# Patient Record
Sex: Female | Born: 1980 | Race: White | Hispanic: No | Marital: Married | Smoking: Former smoker
Health system: Southern US, Community
[De-identification: ages and names within clinical notes are randomized; demographics above are authoritative.]

## PROBLEM LIST (undated history)

## (undated) DIAGNOSIS — T8859XA Other complications of anesthesia, initial encounter: Secondary | ICD-10-CM

## (undated) DIAGNOSIS — T4145XA Adverse effect of unspecified anesthetic, initial encounter: Secondary | ICD-10-CM

## (undated) DIAGNOSIS — T7840XA Allergy, unspecified, initial encounter: Secondary | ICD-10-CM

## (undated) HISTORY — DX: Allergy, unspecified, initial encounter: T78.40XA

---

## 2001-08-11 ENCOUNTER — Other Ambulatory Visit: Admission: RE | Admit: 2001-08-11 | Discharge: 2001-08-11 | Payer: Self-pay | Admitting: Obstetrics and Gynecology

## 2001-08-11 ENCOUNTER — Other Ambulatory Visit: Admission: RE | Admit: 2001-08-11 | Discharge: 2001-08-11 | Payer: Self-pay | Admitting: Gynecology

## 2002-02-02 ENCOUNTER — Other Ambulatory Visit: Admission: RE | Admit: 2002-02-02 | Discharge: 2002-02-02 | Payer: Self-pay | Admitting: Obstetrics and Gynecology

## 2002-04-20 ENCOUNTER — Other Ambulatory Visit: Admission: RE | Admit: 2002-04-20 | Discharge: 2002-04-20 | Payer: Self-pay | Admitting: Obstetrics and Gynecology

## 2002-07-18 ENCOUNTER — Other Ambulatory Visit: Admission: RE | Admit: 2002-07-18 | Discharge: 2002-07-18 | Payer: Self-pay | Admitting: Obstetrics and Gynecology

## 2002-11-03 ENCOUNTER — Other Ambulatory Visit: Admission: RE | Admit: 2002-11-03 | Discharge: 2002-11-03 | Payer: Self-pay | Admitting: Obstetrics and Gynecology

## 2003-05-10 ENCOUNTER — Other Ambulatory Visit: Admission: RE | Admit: 2003-05-10 | Discharge: 2003-05-10 | Payer: Self-pay | Admitting: Obstetrics and Gynecology

## 2004-05-05 ENCOUNTER — Other Ambulatory Visit: Admission: RE | Admit: 2004-05-05 | Discharge: 2004-05-05 | Payer: Self-pay | Admitting: Obstetrics and Gynecology

## 2006-10-22 ENCOUNTER — Inpatient Hospital Stay (HOSPITAL_COMMUNITY): Admission: RE | Admit: 2006-10-22 | Discharge: 2006-10-25 | Payer: Self-pay | Admitting: Obstetrics and Gynecology

## 2010-09-30 NOTE — Op Note (Signed)
NAME:  Tricia Castaneda, Tricia Castaneda                ACCOUNT NO.:  [id number]   MEDICAL RECORD NO.:  [id number]          PATIENT TYPE:  INP   LOCATION:  9142                          FACILITY:  [facility]   PHYSICIAN:  Randye Lobo, M.D.   DATE OF BIRTH:  11/29/1980   DATE OF PROCEDURE:  10/22/2006  DATE OF DISCHARGE:                               OPERATIVE REPORT   PREOPERATIVE DIAGNOSES:  1. Intrauterine gestation at 84 plus 4 weeks.  2. Homero Fellers breech presentation.   POSTOPERATIVE DIAGNOSES:  1. Intrauterine gestation at 38 plus weeks.  2. Homero Fellers breech presentation.   PROCEDURE:  Primary low segment transverse cesarean section.   SURGEON:  Conley Simmonds, M.D.   ASSISTANT:  Ilda Mori, M.D.   ANESTHESIA:  Spinal.   IV FLUIDS:  2250 mL.   URINE OUTPUT:  240 mL.   ESTIMATED BLOOD LOSS:  700 mL.   COMPLICATIONS:  None   INDICATIONS FOR PROCEDURE:  The patient is a 30 year old, gravida 1,  para 0, Caucasian female at 67 plus [redacted] weeks gestation who was noted to  have a frank breech presentation on examination in the office.  Ultrasound confirmed a frank breech presentation.  The patient now  desires a primary cesarean section with no trial of an external cephalic  version and vaginal delivery.  Final ultrasound prior to beginning the  cesarean section demonstrated again the frank breech presentation.  A  plan is made now to proceed with primary cesarean section after risks,  benefits, and alternatives are reviewed.   FINDINGS:  A viable female was delivered at 12:30.  Apgars 8 at one  minute and 9 at five minutes.  Frank breech presentation was confirmed.  Weight 8 pounds 5 ounces.  Amniotic fluid clear.  The uterus, tubes and  ovaries were normal.   SPECIMENS:  None.   PROCEDURE:  The patient was reidentified in the preoperative hold area.  She received Ancef 1 gram IV for antibiotic prophylaxis.  In the  operating room, she received a spinal anesthetic.  She was placed in the  supine  position with a left lateral tilt, and the abdomen was sterilely  prepped and draped.  A Foley catheter was sterilely placed prior to  this.   An Allis test was performed, and the patient had adequate surgical  anesthesia.  A Pfannenstiel incision was created sharply with a scalpel.  This was carried down to the fascia using monopolar cautery for  hemostasis.  The fascia was incised in the midline and was extended  bilaterally with the Mayo scissors.  The rectus muscles were dissected  off the fascia superiorly and inferiorly.  The rectus muscles were then  sharply divided in the midline.  The parietal peritoneum was entered  sharply after it was elevated with two hemostat clamps.  The peritoneal  incision was extended cranially and caudally.   The lower uterine segment was exposed with a bladder retractor, and a  bladder flap was sharply created.  A transverse lower uterine segment  incision was then created sharply with a scalpel.  Membranes were  ruptured, and  the fluid was clear.  The uterine incision was extended  bilaterally in an upward fashion using a bandage scissors.  A hand was  inserted through the uterine incision, and the baby was noted to have  converted to a partially transverse presentation.  The buttocks were  drawn down to the incision, and with fundal pressure, the buttocks were  delivered.  Each of the legs were then delivered separately and gently.  The arms were swept across the chest, and the vertex was then delivered.  A double nuchal cord was reduced.  The nares and mouth were suctioned.  The cord was doubly clamped and cut, and the newborn was carried over to  the waiting pediatrician in good condition.   The placenta was manually extracted and was set aside for cord blood  collection and cord blood donation.  The uterus was then exteriorized,  and it was wiped clean with a moistened lap pad.  The patient did  receive Pitocin 20 units IV.  The uterine  incision was then closed with  a double layer closure of #1 chromic.  The first was a running locked  layer, and the second was an imbricating layer.  The uterine incision  was noted to be hemostatic.   The uterus was returned to the peritoneal cavity which was irrigated and  suctioned.  The abdomen was closed.  The parietal peritoneum was closed  with a running suture of 3-0 Vicryl.  The rectus muscles were  reapproximated in the midline with interrupted sutures of #1 chromic.  The fascia was closed with a running suture of 0 Vicryl.  The  subcutaneous layer was irrigated and suctioned and made hemostatic with  monopolar cautery.  Staples were used to close the skin, and a sterile  bandage was placed over this.  This concluded the patient's procedure.  There were no complications.  All needle, instrument and sponge counts  were correct.  The patient was escorted to the recovery room in stable  and awake condition.      Randye Lobo, M.D.  Electronically Signed     BES/MEDQ  D:  10/22/2006  T:  10/22/2006  Job:  [id number]

## 2010-10-03 NOTE — Discharge Summary (Signed)
NAME:  Tricia Castaneda, Tricia Castaneda                ACCOUNT NO.:  [id number]   MEDICAL RECORD NO.:  [id number]          PATIENT TYPE:  INP   LOCATION:  9142                          FACILITY:  [facility]   PHYSICIAN:  Malva Limes, M.D.    DATE OF BIRTH:  February 01, 1981   DATE OF ADMISSION:  10/22/2006  DATE OF DISCHARGE:  10/25/2006                               DISCHARGE SUMMARY   FINAL DIAGNOSIS:  Intrauterine pregnancy at 38-4/[redacted] weeks gestation,  frank breech presentation.   PROCEDURE:  Primary low segment transverse cesarean section.  Surgeon  Dr. Conley Simmonds.  Assistant Dr. Ilda Mori.  Complications none.   This 30 year old G1 P0 presents at 38-4/[redacted] weeks gestation having a known  breech presentation.  Ultrasound confirmed this presentation and she now  presents for her cesarean section.  Her antepartum course up to this  point had been uncomplicated.  She was a smoker that quit with pregnancy  and she does have a non-immune rubella status.  Otherwise she had a  negative group B strep culture obtained in the office at 36 weeks.  The  patient is taken to the operating room on October 22, 2006 by Dr. Conley Simmonds where a primary low segment transverse cesarean section was  performed with the delivery of 8 pounds 5 ounces female infant with  Apgars of eight and nine.  Delivery went without complications.  The  patient's postoperative course was benign without any significant  fevers.  The patient was felt ready for discharge on postoperative day  #3.  She was sent home on a regular diet, told to decrease activities,  told to continue her prenatal vitamins, was given a rubella vaccine  before discharge.  Was told she could use Motrin up to 600 mg every 6  hours as needed for pain and was to follow up in the office in 4-6 weeks  with Dr. Edward Jolly.   LABS ON DISCHARGE:  The patient had a hemoglobin of 11.2, white blood  cell count of 13.0 and platelets of 190,000.      [name], P.A.-C.    ______________________________  Malva Limes, M.D.    MB/MEDQ  D:  11/26/2006  T:  11/27/2006  Job:  [id number]

## 2011-03-05 LAB — URINALYSIS, ROUTINE W REFLEX MICROSCOPIC
Bilirubin Urine: NEGATIVE
Nitrite: NEGATIVE
Specific Gravity, Urine: 1.01
Urobilinogen, UA: 0.2
pH: 6

## 2011-03-05 LAB — SAMPLE TO BLOOD BANK

## 2011-03-05 LAB — CBC
Hemoglobin: 12.7
MCHC: 33.8
Platelets: 190
RBC: 3.64 — ABNORMAL LOW
RBC: 4.18
WBC: 13 — ABNORMAL HIGH
WBC: 9.3

## 2011-03-05 LAB — RPR: RPR Ser Ql: NONREACTIVE

## 2011-03-05 LAB — CCBB MATERNAL DONOR DRAW

## 2013-08-11 ENCOUNTER — Ambulatory Visit: Payer: Self-pay | Admitting: Physician Assistant

## 2013-08-11 ENCOUNTER — Encounter (INDEPENDENT_AMBULATORY_CARE_PROVIDER_SITE_OTHER): Payer: Self-pay

## 2013-08-11 ENCOUNTER — Ambulatory Visit (INDEPENDENT_AMBULATORY_CARE_PROVIDER_SITE_OTHER): Payer: BC Managed Care – PPO | Admitting: Physician Assistant

## 2013-08-11 ENCOUNTER — Encounter: Payer: Self-pay | Admitting: Physician Assistant

## 2013-08-11 VITALS — BP 104/69 | HR 90 | Temp 99.4°F | Ht 65.5 in | Wt 160.0 lb

## 2013-08-11 DIAGNOSIS — L709 Acne, unspecified: Secondary | ICD-10-CM

## 2013-08-11 DIAGNOSIS — R635 Abnormal weight gain: Secondary | ICD-10-CM

## 2013-08-11 DIAGNOSIS — L708 Other acne: Secondary | ICD-10-CM

## 2013-08-11 DIAGNOSIS — B360 Pityriasis versicolor: Secondary | ICD-10-CM

## 2013-08-11 MED ORDER — FLUCONAZOLE 150 MG PO TABS: 150.0000 mg | ORAL_TABLET | Freq: Every day | ORAL | Status: DC

## 2013-08-11 MED ORDER — PHENTERMINE HCL 37.5 MG PO CAPS: 37.5000 mg | ORAL_CAPSULE | ORAL | Status: DC

## 2013-08-11 MED ORDER — TRETINOIN 0.025 % EX CREA: TOPICAL_CREAM | Freq: Every day | CUTANEOUS | Status: DC

## 2013-08-11 MED ORDER — KETOCONAZOLE 2 % EX SHAM: 1.0000 "application " | MEDICATED_SHAMPOO | CUTANEOUS | Status: DC

## 2013-08-11 MED ORDER — KETOCONAZOLE 2 % EX SHAM: 1.0000 "application " | MEDICATED_SHAMPOO | Freq: Once | CUTANEOUS | Status: DC

## 2013-08-26 NOTE — Progress Notes (Signed)
Patient ID: Tricia Castaneda, female   DOB: 1981-04-23, 33 y.o.   MRN: [id number]  S: 33 y/o female with c/o worsening acne on face over the past couple of months. Has tried various OTC topicals with no relief.   O: No active lesions on chest and back. Few open and closed comedomes on face, primarily chin and forehead.   Assessment:  1.  Acne of face 2. Obesity  Plan:  Advised patient to continue taking oral contraceptive since this can help with adult acne, along with prescribing .025% tretinoin qhs, bb size amoutn to entire face. Detailed instructions were given on application and the importance of not overusing the product. I do not feel that oral antibiotics are needed at this time. She will RTC if acne worsens or does not improve. We also discussed her recent weight gain and I have agreed to prescribe phentermine 37.5 mg q day on a one time basis. She will need to return to the weight clinic she was previously attending when this prescription is complete.

## 2014-06-01 ENCOUNTER — Other Ambulatory Visit: Payer: Self-pay | Admitting: Physician Assistant

## 2014-07-11 ENCOUNTER — Other Ambulatory Visit: Payer: Self-pay | Admitting: Physician Assistant

## 2014-07-11 MED ORDER — DOXYCYCLINE HYCLATE 100 MG PO CAPS: 100.0000 mg | ORAL_CAPSULE | Freq: Two times a day (BID) | ORAL | Status: DC

## 2015-05-15 ENCOUNTER — Encounter: Payer: Self-pay | Admitting: Physician Assistant

## 2015-05-15 ENCOUNTER — Ambulatory Visit (INDEPENDENT_AMBULATORY_CARE_PROVIDER_SITE_OTHER): Payer: No Typology Code available for payment source | Admitting: Physician Assistant

## 2015-05-15 VITALS — BP 120/84 | HR 87 | Temp 97.9°F | Ht 65.5 in | Wt 172.4 lb

## 2015-05-15 DIAGNOSIS — J069 Acute upper respiratory infection, unspecified: Secondary | ICD-10-CM

## 2015-05-15 MED ORDER — AMOXICILLIN-POT CLAVULANATE 875-125 MG PO TABS: 1.0000 | ORAL_TABLET | Freq: Two times a day (BID) | ORAL | Status: DC

## 2015-05-15 NOTE — Progress Notes (Signed)
Subjective:     Patient ID: Tricia Castaneda, female   DOB: 1980/08/21, 34 y.o.   MRN: [id number]  HPI Pt with cough and congestion for several days Hx of bronchitis  Review of Systems  Constitutional: Negative for fever, activity change, appetite change and fatigue.  HENT: Positive for congestion, postnasal drip and sinus pressure. Negative for ear pain and sore throat.   Respiratory: Positive for cough. Negative for chest tightness, shortness of breath and wheezing.   Cardiovascular: Negative.        Objective:   Physical Exam  Constitutional: She appears well-developed and well-nourished.  HENT:  Right Ear: External ear normal.  Left Ear: External ear normal.  Mouth/Throat: Oropharyngeal exudate present.  Neck: Neck supple.  Cardiovascular: Normal rate, regular rhythm and normal heart sounds.   Pulmonary/Chest: Effort normal. No respiratory distress. She has no wheezes. She exhibits no tenderness.  Coarse lungs sounds that clear well with cough  Lymphadenopathy:    She has no cervical adenopathy.  Nursing note and vitals reviewed.      Assessment:     URI    Plan:     Augmentin 875mg  bid x 10 day Smoking cessation Fluids Rest F/U prn

## 2015-05-15 NOTE — Patient Instructions (Signed)
Upper Respiratory Infection, Adult °Most upper respiratory infections (URIs) are a viral infection of the air passages leading to the lungs. A URI affects the nose, throat, and upper air passages. The most common type of URI is nasopharyngitis and is typically referred to as "the common cold." °URIs run their course and usually go away on their own. Most of the time, a URI does not require medical attention, but sometimes a bacterial infection in the upper airways can follow a viral infection. This is called a secondary infection. Sinus and middle ear infections are common types of secondary upper respiratory infections. °Bacterial pneumonia can also complicate a URI. A URI can worsen asthma and chronic obstructive pulmonary disease (COPD). Sometimes, these complications can require emergency medical care and may be life threatening.  °CAUSES °Almost all URIs are caused by viruses. A virus is a type of germ and can spread from one person to another.  °RISKS FACTORS °You may be at risk for a URI if:  °· You smoke.   °· You have chronic heart or lung disease. °· You have a weakened defense (immune) system.   °· You are very young or very old.   °· You have nasal allergies or asthma. °· You work in crowded or poorly ventilated areas. °· You work in health care facilities or schools. °SIGNS AND SYMPTOMS  °Symptoms typically develop 2-3 days after you come in contact with a cold virus. Most viral URIs last 7-10 days. However, viral URIs from the influenza virus (flu virus) can last 14-18 days and are typically more severe. Symptoms may include:  °· Runny or stuffy (congested) nose.   °· Sneezing.   °· Cough.   °· Sore throat.   °· Headache.   °· Fatigue.   °· Fever.   °· Loss of appetite.   °· Pain in your forehead, behind your eyes, and over your cheekbones (sinus pain). °· Muscle aches.   °DIAGNOSIS  °Your health care provider may diagnose a URI by: °· Physical exam. °· Tests to check that your symptoms are not due to  another condition such as: °¨ Strep throat. °¨ Sinusitis. °¨ Pneumonia. °¨ Asthma. °TREATMENT  °A URI goes away on its own with time. It cannot be cured with medicines, but medicines may be prescribed or recommended to relieve symptoms. Medicines may help: °· Reduce your fever. °· Reduce your cough. °· Relieve nasal congestion. °HOME CARE INSTRUCTIONS  °· Take medicines only as directed by your health care provider.   °· Gargle warm saltwater or take cough drops to comfort your throat as directed by your health care provider. °· Use a warm mist humidifier or inhale steam from a shower to increase air moisture. This may make it easier to breathe. °· Drink enough fluid to keep your urine clear or pale yellow.   °· Eat soups and other clear broths and maintain good nutrition.   °· Rest as needed.   °· Return to work when your temperature has returned to normal or as your health care provider advises. You may need to stay home longer to avoid infecting others. You can also use a face mask and careful hand washing to prevent spread of the virus. °· Increase the usage of your inhaler if you have asthma.   °· Do not use any tobacco products, including cigarettes, chewing tobacco, or electronic cigarettes. If you need help quitting, ask your health care provider. °PREVENTION  °The best way to protect yourself from getting a cold is to practice good hygiene.  °· Avoid oral or hand contact with people with cold   symptoms.   °· Wash your hands often if contact occurs.   °There is no clear evidence that vitamin C, vitamin E, echinacea, or exercise reduces the chance of developing a cold. However, it is always recommended to get plenty of rest, exercise, and practice good nutrition.  °SEEK MEDICAL CARE IF:  °· You are getting worse rather than better.   °· Your symptoms are not controlled by medicine.   °· You have chills. °· You have worsening shortness of breath. °· You have brown or red mucus. °· You have yellow or brown nasal  discharge. °· You have pain in your face, especially when you bend forward. °· You have a fever. °· You have swollen neck glands. °· You have pain while swallowing. °· You have white areas in the back of your throat. °SEEK IMMEDIATE MEDICAL CARE IF:  °· You have severe or persistent: °¨ Headache. °¨ Ear pain. °¨ Sinus pain. °¨ Chest pain. °· You have chronic lung disease and any of the following: °¨ Wheezing. °¨ Prolonged cough. °¨ Coughing up blood. °¨ A change in your usual mucus. °· You have a stiff neck. °· You have changes in your: °¨ Vision. °¨ Hearing. °¨ Thinking. °¨ Mood. °MAKE SURE YOU:  °· Understand these instructions. °· Will watch your condition. °· Will get help right away if you are not doing well or get worse. °  °This information is not intended to replace advice given to you by your health care provider. Make sure you discuss any questions you have with your health care provider. °  °Document Released: 10/28/2000 Document Revised: 09/18/2014 Document Reviewed: 08/09/2013 °Elsevier Interactive Patient Education ©2016 [employer] °Smoking Cessation, Tips for Success °If you are ready to quit smoking, congratulations! You have chosen to help yourself be healthier. Cigarettes bring nicotine, tar, carbon monoxide, and other irritants into your body. Your lungs, heart, and blood vessels will be able to work better without these poisons. There are many different ways to quit smoking. Nicotine gum, nicotine patches, a nicotine inhaler, or nicotine nasal spray can help with physical craving. Hypnosis, support groups, and medicines help break the habit of smoking. °WHAT THINGS CAN I DO TO MAKE QUITTING EASIER?  °Here are some tips to help you quit for good: °· Pick a date when you will quit smoking completely. Tell all of your friends and family about your plan to quit on that date. °· Do not try to slowly cut down on the number of cigarettes you are smoking. Pick a quit date and quit smoking completely  starting on that day. °· Throw away all cigarettes.   °· Clean and remove all ashtrays from your home, work, and car. °· On a card, write down your reasons for quitting. Carry the card with you and read it when you get the urge to smoke. °· Cleanse your body of nicotine. Drink enough water and fluids to keep your urine clear or pale yellow. Do this after quitting to flush the nicotine from your body. °· Learn to predict your moods. Do not let a bad situation be your excuse to have a cigarette. Some situations in your life might tempt you into wanting a cigarette. °· Never have "just one" cigarette. It leads to wanting another and another. Remind yourself of your decision to quit. °· Change habits associated with smoking. If you smoked while driving or when feeling stressed, try other activities to replace smoking. Stand up when drinking your coffee. Brush your teeth after eating. Sit in a   different chair when you read the paper. Avoid alcohol while trying to quit, and try to drink fewer caffeinated beverages. Alcohol and caffeine may urge you to smoke. °· Avoid foods and drinks that can trigger a desire to smoke, such as sugary or spicy foods and alcohol. °· Ask people who smoke not to smoke around you. °· Have something planned to do right after eating or having a cup of coffee. For example, plan to take a walk or exercise. °· Try a relaxation exercise to calm you down and decrease your stress. Remember, you may be tense and nervous for the first 2 weeks after you quit, but this will pass. °· Find new activities to keep your hands busy. Play with a pen, coin, or rubber band. Doodle or draw things on paper. °· Brush your teeth right after eating. This will help cut down on the craving for the taste of tobacco after meals. You can also try mouthwash.   °· Use oral substitutes in place of cigarettes. Try using lemon drops, carrots, cinnamon sticks, or chewing gum. Keep them handy so they are available when you have  the urge to smoke. °· When you have the urge to smoke, try deep breathing. °· Designate your home as a nonsmoking area. °· If you are a heavy smoker, ask your health care provider about a prescription for nicotine chewing gum. It can ease your withdrawal from nicotine. °· Reward yourself. Set aside the cigarette money you save and buy yourself something nice. °· Look for support from others. Join a support group or smoking cessation program. Ask someone at home or at work to help you with your plan to quit smoking. °· Always ask yourself, "Do I need this cigarette or is this just a reflex?" Tell yourself, "Today, I choose not to smoke," or "I do not want to smoke." You are reminding yourself of your decision to quit. °· Do not replace cigarette smoking with electronic cigarettes (commonly called e-cigarettes). The safety of e-cigarettes is unknown, and some may contain harmful chemicals. °· If you relapse, do not give up! Plan ahead and think about what you will do the next time you get the urge to smoke. °HOW WILL I FEEL WHEN I QUIT SMOKING? °You may have symptoms of withdrawal because your body is used to nicotine (the addictive substance in cigarettes). You may crave cigarettes, be irritable, feel very hungry, cough often, get headaches, or have difficulty concentrating. The withdrawal symptoms are only temporary. They are strongest when you first quit but will go away within 10-14 days. When withdrawal symptoms occur, stay in control. Think about your reasons for quitting. Remind yourself that these are signs that your body is healing and getting used to being without cigarettes. Remember that withdrawal symptoms are easier to treat than the major diseases that smoking can cause.  °Even after the withdrawal is over, expect periodic urges to smoke. However, these cravings are generally short lived and will go away whether you smoke or not. Do not smoke! °WHAT RESOURCES ARE AVAILABLE TO HELP ME QUIT SMOKING? °Your  health care provider can direct you to community resources or hospitals for support, which may include: °· Group support. °· Education. °· Hypnosis. °· Therapy. °  °This information is not intended to replace advice given to you by your health care provider. Make sure you discuss any questions you have with your health care provider. °  °Document Released: 01/31/2004 Document Revised: 05/25/2014 Document Reviewed: 10/20/2012 °Elsevier Interactive Patient Education ©  2016 [employer] ° °

## 2015-08-06 ENCOUNTER — Telehealth: Payer: Self-pay | Admitting: Family Medicine

## 2015-08-06 NOTE — Telephone Encounter (Signed)
Pt reported

## 2015-08-08 ENCOUNTER — Ambulatory Visit (INDEPENDENT_AMBULATORY_CARE_PROVIDER_SITE_OTHER): Payer: No Typology Code available for payment source | Admitting: Family Medicine

## 2015-08-08 VITALS — BP 121/87 | HR 73 | Temp 97.3°F | Ht 65.0 in | Wt 173.0 lb

## 2015-08-08 DIAGNOSIS — H6501 Acute serous otitis media, right ear: Secondary | ICD-10-CM | POA: Diagnosis not present

## 2015-08-08 MED ORDER — PSEUDOEPHEDRINE-GUAIFENESIN ER 120-1200 MG PO TB12
1.0000 | ORAL_TABLET | Freq: Two times a day (BID) | ORAL | Status: DC
Start: 2015-08-08 — End: 2016-08-17

## 2015-08-08 MED ORDER — AMOXICILLIN-POT CLAVULANATE 875-125 MG PO TABS: 1.0000 | ORAL_TABLET | Freq: Two times a day (BID) | ORAL | Status: DC

## 2015-08-08 MED ORDER — BETAMETHASONE SOD PHOS & ACET 6 (3-3) MG/ML IJ SUSP
6.0000 mg | Freq: Once | INTRAMUSCULAR | Status: AC
  Administered 2015-08-08: 6 mg via INTRAMUSCULAR

## 2015-08-08 NOTE — Progress Notes (Signed)
Subjective:  Patient ID: Tricia Castaneda, female    DOB: 1981-05-03  Age: 35 y.o. MRN: [id number]  CC: Otalgia   HPI Tricia Castaneda presents for Patient presents with upper respiratory congestion.There is right ear pain for 2-3days. There is moderate sore throat. Patient reports minimal cough. There is no fever no chills no sweats. The patient deniesloss of hearing Onset of initial symptoms was 3-5 days ago. Gradually worsening in spite of home remedies.    History Tricia Castaneda has no past medical history on file.   Tricia Castaneda has past surgical history that includes Cesarean section.   Her family history is not on file.Tricia Castaneda reports that Tricia Castaneda has been smoking.  Tricia Castaneda has never used smokeless tobacco. Tricia Castaneda reports that Tricia Castaneda drinks alcohol. Tricia Castaneda reports that Tricia Castaneda does not use illicit drugs.    ROS Review of Systems  Constitutional: Negative for fever, chills, diaphoresis, appetite change and fatigue.  HENT: Positive for congestion, ear pain and sore throat. Negative for hearing loss, postnasal drip, rhinorrhea and trouble swallowing.   Respiratory: Positive for cough. Negative for chest tightness and shortness of breath.   Cardiovascular: Negative for chest pain and palpitations.  Gastrointestinal: Negative for abdominal pain.  Musculoskeletal: Negative for arthralgias.  Skin: Negative for rash.    Objective:  BP 121/87 mmHg  Pulse 73  Temp(Src) 97.3 F (36.3 C) (Oral)  Ht 5\' 5"  (1.651 m)  Wt 173 lb (78.472 kg)  BMI 28.79 kg/m2  BP Readings from Last 3 Encounters:  08/08/15 121/87  05/15/15 120/84  08/11/13 104/69    Wt Readings from Last 3 Encounters:  08/08/15 173 lb (78.472 kg)  05/15/15 172 lb 6.4 oz (78.2 kg)  08/11/13 160 lb (72.576 kg)     Physical Exam  Constitutional: Tricia Castaneda appears well-developed and well-nourished.  HENT:  Head: Normocephalic and atraumatic.  Right Ear: Tympanic membrane and external ear normal. No decreased hearing is noted.  Left Ear: Tympanic membrane and  external ear normal. No decreased hearing is noted.  Nose: Mucosal edema present. Right sinus exhibits no frontal sinus tenderness. Left sinus exhibits no frontal sinus tenderness.  Mouth/Throat: No oropharyngeal exudate or posterior oropharyngeal erythema.  Neck: No Brudzinski's sign noted.  Pulmonary/Chest: Breath sounds normal. No respiratory distress.  Lymphadenopathy:       Head (right side): No preauricular adenopathy present.       Head (left side): No preauricular adenopathy present.       Right cervical: No superficial cervical adenopathy present.      Left cervical: No superficial cervical adenopathy present.     Lab Results  Component Value Date   WBC 13.0* 10/23/2006   HGB 11.2* 10/23/2006   HCT 32.5* 10/23/2006   PLT 190 10/23/2006    No results found.  Assessment & Plan:   Tricia Castaneda was seen today for otalgia.  Diagnoses and all orders for this visit:  Right acute serous otitis media, recurrence not specified -     betamethasone acetate-betamethasone sodium phosphate (CELESTONE) injection 6 mg; Inject 1 mL (6 mg total) into the muscle once.  Other orders -     amoxicillin-clavulanate (AUGMENTIN) 875-125 MG tablet; Take 1 tablet by mouth 2 (two) times daily. Take all of this medication -     Pseudoephedrine-Guaifenesin [phone] MG TB12; Take 1 tablet by mouth 2 (two) times daily. For congestion     I have discontinued Tricia Castaneda's amoxicillin-clavulanate. I am also having her start on amoxicillin-clavulanate and Pseudoephedrine-Guaifenesin. Additionally, I am having her  maintain her doxycycline and omeprazole. We administered betamethasone acetate-betamethasone sodium phosphate.  Meds ordered this encounter  Medications  . amoxicillin-clavulanate (AUGMENTIN) 875-125 MG tablet    Sig: Take 1 tablet by mouth 2 (two) times daily. Take all of this medication    Dispense:  20 tablet    Refill:  0  . Pseudoephedrine-Guaifenesin [phone] MG TB12    Sig: Take 1 tablet  by mouth 2 (two) times daily. For congestion    Dispense:  20 each    Refill:  0  . betamethasone acetate-betamethasone sodium phosphate (CELESTONE) injection 6 mg    Sig:      Follow-up: Return if symptoms worsen or fail to improve.  Mechele Claude, M.D.

## 2016-07-30 ENCOUNTER — Telehealth: Payer: No Typology Code available for payment source | Admitting: Family

## 2016-07-30 DIAGNOSIS — J329 Chronic sinusitis, unspecified: Secondary | ICD-10-CM

## 2016-07-30 DIAGNOSIS — B9789 Other viral agents as the cause of diseases classified elsewhere: Secondary | ICD-10-CM

## 2016-07-30 MED ORDER — FLUTICASONE PROPIONATE 50 MCG/ACT NA SUSP: 2.0000 | Freq: Every day | NASAL | 6 refills | Status: DC

## 2016-07-30 NOTE — Progress Notes (Signed)

## 2016-08-17 ENCOUNTER — Encounter: Payer: Self-pay | Admitting: Family Medicine

## 2016-08-17 ENCOUNTER — Ambulatory Visit (INDEPENDENT_AMBULATORY_CARE_PROVIDER_SITE_OTHER): Payer: No Typology Code available for payment source | Admitting: Family Medicine

## 2016-08-17 VITALS — BP 123/78 | HR 75 | Temp 97.1°F | Ht 65.0 in | Wt 183.0 lb

## 2016-08-17 DIAGNOSIS — Z6830 Body mass index (BMI) 30.0-30.9, adult: Secondary | ICD-10-CM

## 2016-08-17 DIAGNOSIS — K2100 Gastro-esophageal reflux disease with esophagitis, without bleeding: Secondary | ICD-10-CM

## 2016-08-17 DIAGNOSIS — E6609 Other obesity due to excess calories: Secondary | ICD-10-CM

## 2016-08-17 MED ORDER — PHENTERMINE HCL 37.5 MG PO CAPS: 37.5000 mg | ORAL_CAPSULE | ORAL | 2 refills | Status: DC

## 2016-08-17 NOTE — Patient Instructions (Signed)

## 2016-08-17 NOTE — Progress Notes (Signed)
Subjective:  Patient ID: Tricia Castaneda, female    DOB: 1980-06-07  Age: 36 y.o. MRN: [id number]  CC: Weight Loss (pt here today to discuss weight loss and possibly getting a rx)   HPI Tricia Castaneda presents for Weight gain related to sedentary lifestyle brought on by a new job. She found herself sitting at a desk all day and having snacks handy. She is trying to eliminate snacks but finds herself hungry all the time. She is also restarted an exercise program. [facility] [employer] and is started working out regularly. She has a goal weight of 140-150 pounds. She has taken Adipex in the past and requests new prescription for that. It worked well for her at the time prior to the job change.  History Tricia Castaneda has no past medical history on file.   She has a past surgical history that includes Cesarean section.   Her family history is not on file.She reports that she quit smoking about 6 months ago. She has never used smokeless tobacco. She reports that she drinks alcohol. She reports that she does not use drugs.  Current Outpatient Prescriptions on File Prior to Visit  Medication Sig Dispense Refill  . fluticasone (FLONASE) 50 MCG/ACT nasal spray Place 2 sprays into both nostrils daily. 16 g 6  . omeprazole (PRILOSEC) 20 MG capsule Take 20 mg by mouth daily. Reported on 08/08/2015     No current facility-administered medications on file prior to visit.     ROS Review of Systems  Constitutional: Negative for activity change, appetite change and fever.  HENT: Negative for congestion, rhinorrhea and sore throat.   Eyes: Negative for visual disturbance.  Respiratory: Negative for cough and shortness of breath.   Cardiovascular: Negative for chest pain and palpitations.  Gastrointestinal: Negative for abdominal pain, diarrhea and nausea.  Genitourinary: Negative for dysuria.  Musculoskeletal: Negative for arthralgias and myalgias.    Objective:  BP 123/78   Pulse 75   Temp 97.1 F  (36.2 C) (Oral)   Ht  (1.651 m)   Wt 183 lb (83 kg)   LMP 08/01/2016 (Exact Date)   BMI 30.45 kg/m   Physical Exam  Constitutional: She is oriented to person, place, and time. She appears well-developed and well-nourished. No distress.  HENT:  Head: Normocephalic and atraumatic.  Eyes: Conjunctivae are normal. Pupils are equal, round, and reactive to light.  Neck: Normal range of motion. Neck supple. No thyromegaly present.  Cardiovascular: Normal rate, regular rhythm and normal heart sounds.   No murmur heard. Pulmonary/Chest: Effort normal and breath sounds normal. No respiratory distress. She has no wheezes. She has no rales.  Abdominal: Soft. She exhibits no distension.  Musculoskeletal: Normal range of motion.  Lymphadenopathy:    She has no cervical adenopathy.  Neurological: She is alert and oriented to person, place, and time.  Skin: Skin is warm and dry.  Psychiatric: She has a normal mood and affect. Her behavior is normal. Judgment and thought content normal.    Assessment & Plan:   Cathie was seen today for weight loss.  Diagnoses and all orders for this visit:  Gastroesophageal reflux disease with esophagitis  Class 1 obesity due to excess calories without serious comorbidity with body mass index (BMI) of 30.0 to 30.9 in adult  Other orders -     phentermine 37.5 MG capsule; Take 1 capsule (37.5 mg total) by mouth every morning.   I have discontinued Ms. Castaneda's doxycycline, amoxicillin-clavulanate,  and Pseudoephedrine-Guaifenesin. I am also having her start on phentermine. Additionally, I am having her maintain her omeprazole and fluticasone.  Meds ordered this encounter  Medications  . phentermine 37.5 MG capsule    Sig: Take 1 capsule (37.5 mg total) by mouth every morning.    Dispense:  30 capsule    Refill:  2     Follow-up: Return in about 3 months (around 11/16/2016).  Mechele Claude, M.D.

## 2016-11-17 ENCOUNTER — Ambulatory Visit: Payer: No Typology Code available for payment source | Admitting: Family Medicine

## 2017-06-08 ENCOUNTER — Ambulatory Visit (INDEPENDENT_AMBULATORY_CARE_PROVIDER_SITE_OTHER): Payer: PRIVATE HEALTH INSURANCE | Admitting: Family

## 2017-06-08 ENCOUNTER — Encounter: Payer: Self-pay | Admitting: Family

## 2017-06-08 VITALS — BP 134/92 | HR 71 | Temp 97.8°F | Ht 65.0 in | Wt 196.0 lb

## 2017-06-08 DIAGNOSIS — J019 Acute sinusitis, unspecified: Secondary | ICD-10-CM | POA: Diagnosis not present

## 2017-06-08 DIAGNOSIS — M545 Low back pain, unspecified: Secondary | ICD-10-CM

## 2017-06-08 MED ORDER — AMOXICILLIN-POT CLAVULANATE 875-125 MG PO TABS: 1.0000 | ORAL_TABLET | Freq: Two times a day (BID) | ORAL | 0 refills | Status: DC

## 2017-06-08 MED ORDER — PREDNISONE 10 MG (21) PO TBPK: ORAL_TABLET | ORAL | 0 refills | Status: DC

## 2017-06-08 NOTE — Patient Instructions (Signed)

## 2017-06-08 NOTE — Progress Notes (Signed)
   Subjective:    Patient ID: Tricia Castaneda, female    DOB: 1980-12-22, 37 y.o.   MRN: [id number]  Sinusitis  This is a new problem. The current episode started 1 to 4 weeks ago. The problem has been gradually worsening since onset. There has been no fever. Her pain is at a severity of 8/10. The pain is moderate. Associated symptoms include congestion, coughing, headaches, sinus pressure, sneezing and a sore throat. Pertinent negatives include no ear pain or hoarse voice. Past treatments include spray decongestants and antibiotics. The treatment provided mild relief.  Back Pain  This is a new problem. The current episode started in the past 7 days. The problem occurs intermittently. The problem has been waxing and waning since onset. The pain is present in the lumbar spine. Associated symptoms include headaches. She has tried bed rest for the symptoms.      Review of Systems  HENT: Positive for congestion, sinus pressure, sneezing and sore throat. Negative for ear pain and hoarse voice.   Respiratory: Positive for cough.   Musculoskeletal: Positive for back pain.  Neurological: Positive for headaches.  All other systems reviewed and are negative.      Objective:   Physical Exam  Constitutional: She is oriented to person, place, and time. She appears well-developed and well-nourished. No distress.  HENT:  Head: Normocephalic and atraumatic.  Right Ear: External ear normal.  Left Ear: Tympanic membrane is bulging.  Nose: Mucosal edema and rhinorrhea present.  Mouth/Throat: Posterior oropharyngeal erythema present.  Eyes: Pupils are equal, round, and reactive to light.  Neck: Normal range of motion. Neck supple. No thyromegaly present.  Cardiovascular: Normal rate, regular rhythm, normal heart sounds and intact distal pulses.  No murmur heard. Pulmonary/Chest: Effort normal and breath sounds normal. No respiratory distress. She has no wheezes.  Abdominal: Soft. Bowel sounds are  normal. She exhibits no distension. There is no tenderness.  Musculoskeletal: Normal range of motion. She exhibits tenderness. She exhibits no edema.  Mild tenderness in right lower lumbar   Neurological: She is alert and oriented to person, place, and time. She has normal reflexes. No cranial nerve deficit.  Skin: Skin is warm and dry.  Psychiatric: She has a normal mood and affect. Her behavior is normal. Judgment and thought content normal.  Vitals reviewed.   BP (!) 139/94   Pulse 73   Temp 97.8 F (36.6 C) (Oral)   Ht 5\' 5"  (1.651 m)   Wt 196 lb (88.9 kg)   BMI 32.62 kg/m      Assessment & Plan:  1. Acute sinusitis, recurrence not specified, unspecified location - Take meds as prescribed - Use a cool mist humidifier  -Force fluids -For any cough or congestion  Use plain Mucinex- regular strength or max strength is fine   * Children- consult with Pharmacist for dosing -For fever or aces or pains- take tylenol or ibuprofen appropriate for age and weight -Throat lozenges if help -New toothbrush in 3 days - amoxicillin-clavulanate (AUGMENTIN) 875-125 MG tablet; Take 1 tablet by mouth 2 (two) times daily.  Dispense: 14 tablet; Refill: 0 - predniSONE (STERAPRED UNI-PAK 21 TAB) 10 MG (21) TBPK tablet; Use as directed  Dispense: 21 tablet; Refill: 0  2. Acute right-sided low back pain without sciatica Rest Ice  ROM exercises discussed  - predniSONE (STERAPRED UNI-PAK 21 TAB) 10 MG (21) TBPK tablet; Use as directed  Dispense: 21 tablet; Refill: 0   Tricia Rodneyhristy Lerline Valdivia, FNP

## 2017-07-19 ENCOUNTER — Other Ambulatory Visit: Payer: Self-pay

## 2017-07-19 ENCOUNTER — Emergency Department (HOSPITAL_COMMUNITY): Payer: No Typology Code available for payment source

## 2017-07-19 ENCOUNTER — Encounter (HOSPITAL_COMMUNITY): Payer: Self-pay | Admitting: *Deleted

## 2017-07-19 ENCOUNTER — Observation Stay (HOSPITAL_COMMUNITY)
Admission: EM | Admit: 2017-07-19 | Discharge: 2017-07-20 | Disposition: A | Discharge status: Home / Self Care | Payer: No Typology Code available for payment source | Attending: General Surgery | Admitting: General Surgery

## 2017-07-19 DIAGNOSIS — K810 Acute cholecystitis: Secondary | ICD-10-CM | POA: Diagnosis not present

## 2017-07-19 DIAGNOSIS — K8000 Calculus of gallbladder with acute cholecystitis without obstruction: Secondary | ICD-10-CM | POA: Diagnosis present

## 2017-07-19 DIAGNOSIS — K219 Gastro-esophageal reflux disease without esophagitis: Secondary | ICD-10-CM | POA: Diagnosis not present

## 2017-07-19 DIAGNOSIS — K8010 Calculus of gallbladder with chronic cholecystitis without obstruction: Principal | ICD-10-CM | POA: Insufficient documentation

## 2017-07-19 DIAGNOSIS — Z87891 Personal history of nicotine dependence: Secondary | ICD-10-CM | POA: Diagnosis not present

## 2017-07-19 HISTORY — DX: Other complications of anesthesia, initial encounter: T88.59XA

## 2017-07-19 HISTORY — DX: Adverse effect of unspecified anesthetic, initial encounter: T41.45XA

## 2017-07-19 LAB — COMPREHENSIVE METABOLIC PANEL
ALBUMIN: 4.4 g/dL (ref 3.5–5.0)
ALT: 16 U/L (ref 14–54)
AST: 22 U/L (ref 15–41)
Alkaline Phosphatase: 41 U/L (ref 38–126)
Anion gap: 16 — ABNORMAL HIGH (ref 5–15)
BUN: 11 mg/dL (ref 6–20)
CHLORIDE: 98 mmol/L — AB (ref 101–111)
CO2: 21 mmol/L — AB (ref 22–32)
Calcium: 8.6 mg/dL — ABNORMAL LOW (ref 8.9–10.3)
Creatinine, Ser: 0.7 mg/dL (ref 0.44–1.00)
GFR calc Af Amer: >60 mL/min (ref >60)
GFR calc non Af Amer: >60 mL/min (ref >60)
GLUCOSE: 115 mg/dL — AB (ref 65–99)
POTASSIUM: 3.7 mmol/L (ref 3.5–5.1)
SODIUM: 135 mmol/L (ref 135–145)
Total Bilirubin: 0.4 mg/dL (ref 0.3–1.2)
Total Protein: 8 g/dL (ref 6.5–8.1)

## 2017-07-19 LAB — URINALYSIS, ROUTINE W REFLEX MICROSCOPIC
Bilirubin Urine: NEGATIVE
GLUCOSE, UA: NEGATIVE mg/dL
HGB URINE DIPSTICK: NEGATIVE
KETONES UR: 5 mg/dL — AB
Leukocytes, UA: NEGATIVE
Nitrite: NEGATIVE
PH: 6 (ref 5.0–8.0)
Protein, ur: NEGATIVE mg/dL
Specific Gravity, Urine: 1.005 (ref 1.005–1.030)

## 2017-07-19 LAB — HCG, QUANTITATIVE, PREGNANCY: hCG, Beta Chain, Quant, S: <1 m[IU]/mL (ref <5)

## 2017-07-19 LAB — CBC
HEMATOCRIT: 40.3 % (ref 36.0–46.0)
Hemoglobin: 13.3 g/dL (ref 12.0–15.0)
MCH: 29.8 pg (ref 26.0–34.0)
MCHC: 33 g/dL (ref 30.0–36.0)
MCV: 90.4 fL (ref 78.0–100.0)
Platelets: 346 10*3/uL (ref 150–400)
RBC: 4.46 MIL/uL (ref 3.87–5.11)
RDW: 13 % (ref 11.5–15.5)
WBC: 14.7 10*3/uL — ABNORMAL HIGH (ref 4.0–10.5)

## 2017-07-19 LAB — LIPASE, BLOOD: LIPASE: 27 U/L (ref 11–51)

## 2017-07-19 MED ORDER — CHLORHEXIDINE GLUCONATE CLOTH 2 % EX PADS
6.0000 | MEDICATED_PAD | Freq: Once | CUTANEOUS | Status: AC
  Administered 2017-07-20: 6 via TOPICAL

## 2017-07-19 MED ORDER — CHLORHEXIDINE GLUCONATE CLOTH 2 % EX PADS
6.0000 | MEDICATED_PAD | Freq: Once | CUTANEOUS | Status: AC
  Administered 2017-07-19: 6 via TOPICAL

## 2017-07-19 MED ORDER — ENOXAPARIN SODIUM 40 MG/0.4ML ~~LOC~~ SOLN
40.0000 mg | SUBCUTANEOUS | Status: DC
  Administered 2017-07-19: 40 mg via SUBCUTANEOUS
  Filled 2017-07-19 (×2): qty 0.4

## 2017-07-19 MED ORDER — ACETAMINOPHEN 650 MG RE SUPP: 650.0000 mg | Freq: Four times a day (QID) | RECTAL | Status: DC | PRN

## 2017-07-19 MED ORDER — LACTATED RINGERS IV SOLN
INTRAVENOUS | Status: AC
  Administered 2017-07-19 – 2017-07-20 (×2): via INTRAVENOUS

## 2017-07-19 MED ORDER — ACETAMINOPHEN 325 MG PO TABS
650.0000 mg | ORAL_TABLET | Freq: Four times a day (QID) | ORAL | Status: DC | PRN
  Administered 2017-07-19: 650 mg via ORAL
  Filled 2017-07-19: qty 2

## 2017-07-19 MED ORDER — MORPHINE SULFATE (PF) 2 MG/ML IV SOLN: 1.0000 mg | INTRAVENOUS | Status: DC | PRN

## 2017-07-19 MED ORDER — CIPROFLOXACIN IN D5W 400 MG/200ML IV SOLN
400.0000 mg | Freq: Once | INTRAVENOUS | Status: AC
  Administered 2017-07-19: 400 mg via INTRAVENOUS
  Filled 2017-07-19: qty 200

## 2017-07-19 MED ORDER — IOPAMIDOL (ISOVUE-300) INJECTION 61%
100.0000 mL | Freq: Once | INTRAVENOUS | Status: AC | PRN
  Administered 2017-07-19: 100 mL via INTRAVENOUS

## 2017-07-19 MED ORDER — ONDANSETRON HCL 4 MG/2ML IJ SOLN: 4.0000 mg | Freq: Four times a day (QID) | INTRAMUSCULAR | Status: DC | PRN

## 2017-07-19 MED ORDER — ONDANSETRON HCL 4 MG PO TABS: 4.0000 mg | ORAL_TABLET | Freq: Four times a day (QID) | ORAL | Status: DC | PRN

## 2017-07-19 MED ORDER — CIPROFLOXACIN IN D5W 400 MG/200ML IV SOLN
400.0000 mg | Freq: Two times a day (BID) | INTRAVENOUS | Status: DC
  Administered 2017-07-20: 400 mg via INTRAVENOUS
  Filled 2017-07-19: qty 200

## 2017-07-19 NOTE — Progress Notes (Signed)
Patient is scheduled for laparoscopic cholecystectomy tomorrow.  NPO after midnight.

## 2017-07-19 NOTE — Progress Notes (Signed)
Pharmacy Antibiotic Note  Tricia Castaneda is a 37 y.o. female admitted on 07/19/2017 with Intra-abdominal Infection.  Pharmacy has been consulted for Cipro dosing.  Plan: Cipro 400mg  IV every 12 hours. Dose stable for age, weight, renal function and indication. No pharmacokinetic monitoring needed. Sign off.  Height: 5\' 5"  (165.1 cm) Weight: 190 lb (86.2 kg) IBW/kg (Calculated) : 57  Temp (24hrs), Avg:99 F (37.2 C), Min:99 F (37.2 C), Max:99 F (37.2 C)  Recent Labs  Lab 07/19/17 1528  WBC 14.7*  CREATININE 0.70    Estimated Creatinine Clearance: 103.4 mL/min (by C-G formula based on SCr of 0.7 mg/dL).    No Known Allergies  Antimicrobials this admission: Cipro 3/4 >>    >>   Dose adjustments this admission: n/a   Microbiology results:  BCx:   UCx:    Sputum:    MRSA PCR:   Thank you for allowing pharmacy to be a part of this patient's care.  Tricia Castaneda, Tricia Castaneda 07/19/2017 8:37 PM

## 2017-07-19 NOTE — ED Triage Notes (Signed)
PT went to urgent care in [place] today and was told to come to ED. PT c/o RUQ pain that radiates to her upper back that started yesterday after eating nachos with nausea and vomiting. Pt denies any urinary symptoms. PA at urgent care reports WBC 21.1 today and positive Murphy's sign. PT was given Zofran and GI cocktail prior to ED arrival.

## 2017-07-19 NOTE — ED Notes (Signed)
MD at bedside. 

## 2017-07-19 NOTE — ED Notes (Signed)
Have notified pt of surgery tomorrow at 7:30 am

## 2017-07-19 NOTE — H&P (Signed)
History and Physical    Tricia Castaneda ZOX:[id number] DOB: 09-Oct-1980 DOA: 07/19/2017  PCP: Dettinger, Elige Radon, MD   Patient coming from: Home  Chief Complaint: Abdominal pain  HPI: Tricia Castaneda is a 37 y.o. female with past medical history significant for prior C-section who presents to the ED with complaints of some right upper quadrant abdominal pain that began shortly after having some nachos yesterday evening.  The pain was noted to be stabbing in nature and radiated to her back.  This was accompanied with some nausea and several episodes of vomiting.  She decided to go to the urgent care facility earlier today and received some Zofran for her symptoms and lab work demonstrated some leukocytosis for which she was told to come to the emergency department.  There are no particular aggravating or alleviating factors. She denies any fever or chills. She denies any chest pain or shortness of breath.   ED Course: Vital signs are stable and lab work demonstrates leukocytosis of 14,700.  CT of the abdomen and pelvis with contrast demonstrates findings of a large calcified stone in the gallbladder along with findings suggestive of acute cholecystitis.  She has been kept n.p.o. and started on some ciprofloxacin as recommended by Dr. Lovell Sheehan of general surgery who will see the patient in the morning for consideration of laparoscopic cholecystectomy.  Review of Systems: All others reviewed and otherwise negative.  History reviewed. No pertinent past medical history.  Past Surgical History:  Procedure Laterality Date  . CESAREAN SECTION       reports that she quit smoking about 18 months ago. she has never used smokeless tobacco. She reports that she drinks alcohol. She reports that she does not use drugs.  No Known Allergies  History reviewed. No pertinent family history.  Prior to Admission medications   Medication Sig Start Date End Date Taking? Authorizing Provider  alum & mag  hydroxide-simeth (MAALOX/MYLANTA) 200-200-20 MG/5ML suspension Take 30 mLs by mouth every 6 (six) hours as needed for indigestion or heartburn.   Yes [provider]  BLISOVI FE 1.5/30 1.5-30 MG-MCG tablet Take 1 tablet by mouth daily.  05/21/17  Yes [provider]  fluticasone (FLONASE) 50 MCG/ACT nasal spray Place 2 sprays into both nostrils daily. Patient taking differently: Place 2 sprays into both nostrils daily as needed for allergies.  07/30/16  Yes Hawks, Christy A, FNP  ibuprofen (ADVIL,MOTRIN) 200 MG tablet Take 400 mg by mouth every 6 (six) hours as needed.   Yes [provider]  tretinoin (RETIN-A) 0.1 % cream tretinoin 0.1 % topical cream  APPLY TO THE AFFECTED AREA(S) BY TOPICAL ROUTE ONCE DAILY AT BEDTIME   Yes [provider]  amoxicillin-clavulanate (AUGMENTIN) 875-125 MG tablet Take 1 tablet by mouth 2 (two) times daily. Patient not taking: Reported on 07/19/2017 06/08/17   Junie Spencer, FNP    Physical Exam: Vitals:   07/19/17 1527 07/19/17 1530 07/19/17 1750 07/19/17 1955  BP:  (!) 141/94 (!) 149/97 (!) 134/92  Pulse:  92 87 79  Resp:  18 16 15   Temp:  99 F (37.2 C)    TempSrc:  Oral    SpO2:  99% 98% 98%  Weight: 86.2 kg (190 lb)     Height: 5\' 5"  (1.651 m)       Constitutional: NAD, calm, comfortable Vitals:   07/19/17 1527 07/19/17 1530 07/19/17 1750 07/19/17 1955  BP:  (!) 141/94 (!) 149/97 (!) 134/92  Pulse:  92 87  79  Resp:  18 16 15   Temp:  99 F (37.2 C)    TempSrc:  Oral    SpO2:  99% 98% 98%  Weight: 86.2 kg (190 lb)     Height: 5\' 5"  (1.651 m)      Eyes: lids and conjunctivae normal ENMT: Mucous membranes are moist.  Neck: normal, supple Respiratory: clear to auscultation bilaterally. Normal respiratory effort. No accessory muscle use.  Cardiovascular: Regular rate and rhythm, no murmurs. No extremity edema. Abdomen: Diffuse tenderness to palpation.  No Murphy sign.  No distention. Bowel sounds positive.    Musculoskeletal:  No joint deformity upper and lower extremities.   Skin: no rashes, lesions, ulcers.  Psychiatric: Normal judgment and insight. Alert and oriented x 3. Normal mood.   Labs on Admission: I have personally reviewed following labs and imaging studies  CBC: Recent Labs  Lab 07/19/17 1528  WBC 14.7*  HGB 13.3  HCT 40.3  MCV 90.4  PLT 346   Basic Metabolic Panel: Recent Labs  Lab 07/19/17 1528  NA 135  K 3.7  CL 98*  CO2 21*  GLUCOSE 115*  BUN 11  CREATININE 0.70  CALCIUM 8.6*   GFR: Estimated Creatinine Clearance: 103.4 mL/min (by C-G formula based on SCr of 0.7 mg/dL). Liver Function Tests: Recent Labs  Lab 07/19/17 1528  AST 22  ALT 16  ALKPHOS 41  BILITOT 0.4  PROT 8.0  ALBUMIN 4.4   Recent Labs  Lab 07/19/17 1528  LIPASE 27   No results for input(s): AMMONIA in the last 168 hours. Coagulation Profile: No results for input(s): INR, PROTIME in the last 168 hours. Cardiac Enzymes: No results for input(s): CKTOTAL, CKMB, CKMBINDEX, TROPONINI in the last 168 hours. BNP (last 3 results) No results for input(s): PROBNP in the last 8760 hours. HbA1C: No results for input(s): HGBA1C in the last 72 hours. CBG: No results for input(s): GLUCAP in the last 168 hours. Lipid Profile: No results for input(s): CHOL, HDL, LDLCALC, TRIG, CHOLHDL, LDLDIRECT in the last 72 hours. Thyroid Function Tests: No results for input(s): TSH, T4TOTAL, FREET4, T3FREE, THYROIDAB in the last 72 hours. Anemia Panel: No results for input(s): VITAMINB12, FOLATE, FERRITIN, TIBC, IRON, RETICCTPCT in the last 72 hours. Urine analysis:    Component Value Date/Time   COLORURINE STRAW (A) 07/19/2017 1755   APPEARANCEUR CLEAR 07/19/2017 1755   LABSPEC 1.005 07/19/2017 1755   PHURINE 6.0 07/19/2017 1755   GLUCOSEU NEGATIVE 07/19/2017 1755   HGBUR NEGATIVE 07/19/2017 1755   BILIRUBINUR NEGATIVE 07/19/2017 1755   KETONESUR 5 (A) 07/19/2017 1755   PROTEINUR NEGATIVE  07/19/2017 1755   UROBILINOGEN 0.2 10/21/2006 1500   NITRITE NEGATIVE 07/19/2017 1755   LEUKOCYTESUR NEGATIVE 07/19/2017 1755    Radiological Exams on Admission: Ct Abdomen Pelvis W Contrast  Result Date: 07/19/2017 CLINICAL DATA:  Right upper quadrant abdominal pain EXAM: CT ABDOMEN AND PELVIS WITH CONTRAST TECHNIQUE: Multidetector CT imaging of the abdomen and pelvis was performed using the standard protocol following bolus administration of intravenous contrast. CONTRAST:  100mL ISOVUE-300 IOPAMIDOL (ISOVUE-300) INJECTION 61% COMPARISON:  None. FINDINGS: Lower chest: No acute abnormality. Hepatobiliary: No focal hepatic abnormality. Large 3 cm calcified stone in the gallbladder neck. Gallbladder wall thickening and pericholecystic fluid. No biliary dilatation Pancreas: Unremarkable. No pancreatic ductal dilatation or surrounding inflammatory changes. Spleen: Normal in size without focal abnormality. Adrenals/Urinary Tract: Adrenal glands are within normal limits. Subcentimeter hypodensities in the kidneys too small to further characterize. The bladder is normal.  Stomach/Bowel: Stomach is within normal limits. Appendix appears normal. No evidence of bowel wall thickening, distention, or inflammatory changes. Vascular/Lymphatic: No significant vascular findings are present. No enlarged abdominal or pelvic lymph nodes. Reproductive: Uterus and bilateral adnexa are unremarkable. Other: Negative for free air or free fluid. Small fat in the umbilical region. Musculoskeletal: No acute or significant osseous findings. IMPRESSION: 1. Large calcified gallstone in the gallbladder neck with gallbladder wall thickening and pericholecystic fluid; collective findings are suspicious for an acute cholecystitis. No biliary dilatation is seen. 2. There are no other acute abnormalities visualized. Electronically Signed   By: Jasmine Pang M.D.   On: 07/19/2017 19:13    Assessment/Plan Active Problems:   Acute  cholecystitis    1. Acute cholecystitis.  Consultation to general surgery for further evaluation in a.m.  ED physician has spoken with Dr. Lovell Sheehan who is aware.  Maintain on gentle IV fluids overnight and give clear liquid diet until midnight after which point she will remain n.p.o.  Pain management with IV morphine as needed.  Ciprofloxacin 400 mg IV twice daily as recommended.   DVT prophylaxis: Lovenox Code Status: Full Family Communication: Mother and husband at bedside Disposition Plan:Per GS Consults called:General Surgery Dr. Lovell Sheehan Admission status: Inpatient, med-surg   Pratik Hoover Brunette DO [facility] Pager [phone]  If 7PM-7AM, please contact night-coverage [email or web address] Password [email or web address]  07/19/2017, 8:14 PM

## 2017-07-19 NOTE — ED Provider Notes (Signed)
[facility] EMERGENCY DEPARTMENT Provider Note   CSN: [id number] Arrival date & time: 07/19/17  1520     History   Chief Complaint Chief Complaint  Patient presents with  . Abdominal Pain    HPI Tricia Castaneda is a 37 y.o. female.  She presents to the emergency department with abdominal pain right-sided right upper greater than right lower radiating to her posterior scapular area on the right since last evening.  It waxes and wanes and never fully goes away but at its most severe as a 7 out of 10.  Stabbing in nature.  Associated with nausea and vomiting.  She went to urgent care where he was found to have an elevated white count and was referred to the emergency department.  There they gave her Zofran and a GI cocktail.  Currently her pain she rates as mild.  She is never had episodes like this before.  She has a history of cesarean section but still has her gallbladder and appendix.  The history is provided by the patient.  Abdominal Pain   This is a new problem. The current episode started yesterday. The pain is associated with an unknown factor. The pain is located in the RUQ. The quality of the pain is cramping. The pain is at a severity of 7/10. Associated symptoms include nausea and vomiting. Pertinent negatives include fever, diarrhea, hematochezia, melena, constipation, dysuria, frequency, hematuria and arthralgias. Nothing aggravates the symptoms. Nothing relieves the symptoms.    History reviewed. No pertinent past medical history.  There are no active problems to display for this patient.   Past Surgical History:  Procedure Laterality Date  . CESAREAN SECTION      OB History    No data available       Home Medications    Prior to Admission medications   Medication Sig Start Date End Date Taking? Authorizing Provider  amoxicillin-clavulanate (AUGMENTIN) 875-125 MG tablet Take 1 tablet by mouth 2 (two) times daily. 06/08/17   Junie Spencer, FNP  BLISOVI FE  1.5/30 1.5-30 MG-MCG tablet  05/21/17   [provider]  fluticasone (FLONASE) 50 MCG/ACT nasal spray Place 2 sprays into both nostrils daily. 07/30/16   Junie Spencer, FNP  omeprazole (PRILOSEC) 20 MG capsule Take 20 mg by mouth daily. Reported on 08/08/2015    [provider]  predniSONE (STERAPRED UNI-PAK 21 TAB) 10 MG (21) TBPK tablet Use as directed 06/08/17   [name] A, FNP  tretinoin (RETIN-A) 0.1 % cream tretinoin 0.1 % topical cream  APPLY TO THE AFFECTED AREA(S) BY TOPICAL ROUTE ONCE DAILY AT BEDTIME    [provider]    Family History History reviewed. No pertinent family history.  Social History Social History   Tobacco Use  . Smoking status: Former Smoker    Last attempt to quit: 01/18/2016    Years since quitting: 1.5  . Smokeless tobacco: Never Used  Substance Use Topics  . Alcohol use: Yes    Comment: social  . Drug use: No     Allergies   Patient has no known allergies.   Review of Systems Review of Systems  Constitutional: Negative for chills and fever.  HENT: Negative for ear pain and sore throat.   Eyes: Negative for pain and visual disturbance.  Respiratory: Negative for cough and shortness of breath.   Cardiovascular: Negative for chest pain and palpitations.  Gastrointestinal: Positive for abdominal pain, nausea and vomiting. Negative for constipation, diarrhea, hematochezia and  melena.  Genitourinary: Negative for dysuria, frequency and hematuria.  Musculoskeletal: Negative for arthralgias and back pain.  Skin: Negative for color change and rash.  Neurological: Negative for seizures and syncope.  All other systems reviewed and are negative.    Physical Exam Updated Vital Signs BP (!) 149/97   Pulse 87   Temp 99 F (37.2 C) (Oral)   Resp 16   Ht  (1.651 m)   Wt 86.2 kg (190 lb)   SpO2 98%   BMI 31.62 kg/m   Physical Exam  Constitutional: She appears well-developed and well-nourished. No distress.    HENT:  Head: Normocephalic and atraumatic.  Eyes: Conjunctivae are normal.  Neck: Neck supple.  Cardiovascular: Normal rate and regular rhythm.  No murmur heard. Pulmonary/Chest: Effort normal and breath sounds normal. No respiratory distress.  Abdominal: Soft. There is tenderness in the right upper quadrant. There is positive Murphy's sign. There is no rigidity, no guarding and no tenderness at McBurney's point. No hernia.  Musculoskeletal: She exhibits no edema, tenderness or deformity.  Neurological: She is alert.  Skin: Skin is warm and dry. Capillary refill takes less than 2 seconds.  Psychiatric: She has a normal mood and affect.  Nursing note and vitals reviewed.    ED Treatments / Results  Labs (all labs ordered are listed, but only abnormal results are displayed) Labs Reviewed  COMPREHENSIVE METABOLIC PANEL - Abnormal; Notable for the following components:      Result Value   Chloride 98 (*)    CO2 21 (*)    Glucose, Bld 115 (*)    Calcium 8.6 (*)    Anion gap 16 (*)    All other components within normal limits  CBC - Abnormal; Notable for the following components:   WBC 14.7 (*)    All other components within normal limits  LIPASE, BLOOD  HCG, QUANTITATIVE, PREGNANCY  URINALYSIS, ROUTINE W REFLEX MICROSCOPIC    EKG  EKG Interpretation None       Radiology Ct Abdomen Pelvis W Contrast  Result Date: 07/19/2017 CLINICAL DATA:  Right upper quadrant abdominal pain EXAM: CT ABDOMEN AND PELVIS WITH CONTRAST TECHNIQUE: Multidetector CT imaging of the abdomen and pelvis was performed using the standard protocol following bolus administration of intravenous contrast. CONTRAST:  ISOVUE-300 IOPAMIDOL (ISOVUE-300) INJECTION 61% COMPARISON:  None. FINDINGS: Lower chest: No acute abnormality. Hepatobiliary: No focal hepatic abnormality. Large 3 cm calcified stone in the gallbladder neck. Gallbladder wall thickening and pericholecystic fluid. No biliary dilatation  Pancreas: Unremarkable. No pancreatic ductal dilatation or surrounding inflammatory changes. Spleen: Normal in size without focal abnormality. Adrenals/Urinary Tract: Adrenal glands are within normal limits. Subcentimeter hypodensities in the kidneys too small to further characterize. The bladder is normal. Stomach/Bowel: Stomach is within normal limits. Appendix appears normal. No evidence of bowel wall thickening, distention, or inflammatory changes. Vascular/Lymphatic: No significant vascular findings are present. No enlarged abdominal or pelvic lymph nodes. Reproductive: Uterus and bilateral adnexa are unremarkable. Other: Negative for free air or free fluid. Small fat in the umbilical region. Musculoskeletal: No acute or significant osseous findings. IMPRESSION: 1. Large calcified gallstone in the gallbladder neck with gallbladder wall thickening and pericholecystic fluid; collective findings are suspicious for an acute cholecystitis. No biliary dilatation is seen. 2. There are no other acute abnormalities visualized. Electronically Signed   By: Jasmine Pang M.D.   On: 07/19/2017 19:13    Procedures Procedures (including critical care time)  Medications Ordered in ED Medications  lactated  ringers infusion ( Intravenous New Bag/Given 07/20/17 0830)  iopamidol (ISOVUE-300) 61 % injection 100 mL (100 mLs Intravenous Contrast Given 07/19/17 1859)  ciprofloxacin (CIPRO) IVPB 400 mg (0 mg Intravenous Stopped 07/19/17 2055)  Chlorhexidine Gluconate Cloth 2 % PADS 6 each (6 each Topical Given 07/19/17 2337)    And  Chlorhexidine Gluconate Cloth 2 % PADS 6 each (6 each Topical Given 07/20/17 0541)  ondansetron (ZOFRAN) injection 4 mg (4 mg Intravenous Given 07/20/17 0724)  dexamethasone (DECADRON) injection 4 mg (4 mg Intravenous Given 07/20/17 0723)     Initial Impression / Assessment and Plan / ED Course  I have reviewed the triage vital signs and the nursing notes.  Pertinent labs & imaging results that were  available during my care of the patient were reviewed by me and considered in my medical decision making (see chart for details).  Clinical Course as of Jul 21 1736  Mon Jul 19, 2017  1940 Cussed with Dr. Lovell Sheehan general surgery who recommends the patient be admitted to the medical service and he will see the patient tomorrow.  He is asking [place] to start the patient on [place].  Medicine hospitalist paged.  [MB]    Clinical Course User Index [MB] Terrilee Files, MD     Final Clinical Impressions(s) / ED Diagnoses   Final diagnoses:  Calculus of gallbladder with acute cholecystitis without obstruction    ED Discharge Orders    None       Terrilee Files, MD 07/21/17 1740

## 2017-07-20 ENCOUNTER — Encounter (HOSPITAL_COMMUNITY): Payer: Self-pay | Admitting: *Deleted

## 2017-07-20 ENCOUNTER — Observation Stay (HOSPITAL_COMMUNITY): Payer: No Typology Code available for payment source | Admitting: Anesthesiology

## 2017-07-20 ENCOUNTER — Encounter (HOSPITAL_COMMUNITY)
Admission: EM | Disposition: A | Discharge status: Home / Self Care | Payer: Self-pay | Source: Home / Self Care | Attending: Emergency Medicine

## 2017-07-20 DIAGNOSIS — K8000 Calculus of gallbladder with acute cholecystitis without obstruction: Secondary | ICD-10-CM | POA: Diagnosis not present

## 2017-07-20 HISTORY — PX: CHOLECYSTECTOMY: SHX55

## 2017-07-20 LAB — BASIC METABOLIC PANEL
Anion gap: 10 (ref 5–15)
BUN: 8 mg/dL (ref 6–20)
CHLORIDE: 103 mmol/L (ref 101–111)
CO2: 23 mmol/L (ref 22–32)
Calcium: 7.9 mg/dL — ABNORMAL LOW (ref 8.9–10.3)
Creatinine, Ser: 0.75 mg/dL (ref 0.44–1.00)
GFR calc non Af Amer: >60 mL/min (ref >60)
Glucose, Bld: 128 mg/dL — ABNORMAL HIGH (ref 65–99)
POTASSIUM: 3.1 mmol/L — AB (ref 3.5–5.1)
SODIUM: 136 mmol/L (ref 135–145)

## 2017-07-20 LAB — CBC
HEMATOCRIT: 37 % (ref 36.0–46.0)
Hemoglobin: 12.3 g/dL (ref 12.0–15.0)
MCH: 30.1 pg (ref 26.0–34.0)
MCHC: 33.2 g/dL (ref 30.0–36.0)
MCV: 90.5 fL (ref 78.0–100.0)
PLATELETS: 280 10*3/uL (ref 150–400)
RBC: 4.09 MIL/uL (ref 3.87–5.11)
RDW: 13.2 % (ref 11.5–15.5)
WBC: 9.3 10*3/uL (ref 4.0–10.5)

## 2017-07-20 LAB — SURGICAL PCR SCREEN
MRSA, PCR: NEGATIVE
STAPHYLOCOCCUS AUREUS: NEGATIVE

## 2017-07-20 LAB — GLUCOSE, CAPILLARY: Glucose-Capillary: 115 mg/dL — ABNORMAL HIGH (ref 65–99)

## 2017-07-20 SURGERY — LAPAROSCOPIC CHOLECYSTECTOMY
Anesthesia: General

## 2017-07-20 MED ORDER — FENTANYL CITRATE (PF) 100 MCG/2ML IJ SOLN
25.0000 ug | INTRAMUSCULAR | Status: DC | PRN
  Administered 2017-07-20: 25 ug via INTRAVENOUS

## 2017-07-20 MED ORDER — DIPHENHYDRAMINE HCL 50 MG/ML IJ SOLN: 25.0000 mg | Freq: Four times a day (QID) | INTRAMUSCULAR | Status: DC | PRN

## 2017-07-20 MED ORDER — LACTATED RINGERS IV SOLN
INTRAVENOUS | Status: DC
  Administered 2017-07-20: 07:00:00 via INTRAVENOUS

## 2017-07-20 MED ORDER — ENOXAPARIN SODIUM 40 MG/0.4ML ~~LOC~~ SOLN: 40.0000 mg | SUBCUTANEOUS | Status: DC

## 2017-07-20 MED ORDER — FENTANYL CITRATE (PF) 250 MCG/5ML IJ SOLN
INTRAMUSCULAR | Status: AC
  Filled 2017-07-20: qty 5

## 2017-07-20 MED ORDER — HYDROMORPHONE HCL 1 MG/ML IJ SOLN: 1.0000 mg | INTRAMUSCULAR | Status: DC | PRN

## 2017-07-20 MED ORDER — LIDOCAINE HCL (CARDIAC) 20 MG/ML IV SOLN
INTRAVENOUS | Status: DC | PRN
  Administered 2017-07-20: 40 mg via INTRAVENOUS

## 2017-07-20 MED ORDER — ONDANSETRON HCL 4 MG/2ML IJ SOLN
INTRAMUSCULAR | Status: AC
  Filled 2017-07-20: qty 2

## 2017-07-20 MED ORDER — ONDANSETRON 4 MG PO TBDP: 4.0000 mg | ORAL_TABLET | Freq: Four times a day (QID) | ORAL | Status: DC | PRN

## 2017-07-20 MED ORDER — ACETAMINOPHEN 650 MG RE SUPP: 650.0000 mg | Freq: Four times a day (QID) | RECTAL | Status: DC | PRN

## 2017-07-20 MED ORDER — LACTATED RINGERS IV SOLN
INTRAVENOUS | Status: DC
  Administered 2017-07-20: 10:00:00 via INTRAVENOUS

## 2017-07-20 MED ORDER — CIPROFLOXACIN IN D5W 400 MG/200ML IV SOLN: 400.0000 mg | Freq: Two times a day (BID) | INTRAVENOUS | Status: DC

## 2017-07-20 MED ORDER — DEXAMETHASONE SODIUM PHOSPHATE 4 MG/ML IJ SOLN
4.0000 mg | Freq: Once | INTRAMUSCULAR | Status: AC
  Administered 2017-07-20: 4 mg via INTRAVENOUS

## 2017-07-20 MED ORDER — HYDROCODONE-ACETAMINOPHEN 5-325 MG PO TABS
1.0000 | ORAL_TABLET | ORAL | 0 refills | Status: DC | PRN
Start: 2017-07-20 — End: 2017-10-25

## 2017-07-20 MED ORDER — PROPOFOL 10 MG/ML IV BOLUS
INTRAVENOUS | Status: AC
  Filled 2017-07-20: qty 40

## 2017-07-20 MED ORDER — HEMOSTATIC AGENTS (NO CHARGE) OPTIME
TOPICAL | Status: DC | PRN
  Administered 2017-07-20: 1 via TOPICAL

## 2017-07-20 MED ORDER — POVIDONE-IODINE 10 % EX OINT
TOPICAL_OINTMENT | CUTANEOUS | Status: AC
  Filled 2017-07-20: qty 1

## 2017-07-20 MED ORDER — HYDROCODONE-ACETAMINOPHEN 5-325 MG PO TABS
1.0000 | ORAL_TABLET | ORAL | Status: DC | PRN
  Administered 2017-07-20: 2 via ORAL
  Filled 2017-07-20: qty 2

## 2017-07-20 MED ORDER — SUCCINYLCHOLINE CHLORIDE 20 MG/ML IJ SOLN
INTRAMUSCULAR | Status: DC | PRN
  Administered 2017-07-20: 120 mg via INTRAVENOUS

## 2017-07-20 MED ORDER — KETOROLAC TROMETHAMINE 30 MG/ML IJ SOLN: 30.0000 mg | Freq: Four times a day (QID) | INTRAMUSCULAR | Status: DC | PRN

## 2017-07-20 MED ORDER — ONDANSETRON HCL 4 MG/2ML IJ SOLN
4.0000 mg | Freq: Once | INTRAMUSCULAR | Status: AC
  Administered 2017-07-20: 4 mg via INTRAVENOUS

## 2017-07-20 MED ORDER — ONDANSETRON 4 MG PO TBDP: 4.0000 mg | ORAL_TABLET | Freq: Three times a day (TID) | ORAL | 0 refills | Status: DC | PRN

## 2017-07-20 MED ORDER — MIDAZOLAM HCL 2 MG/2ML IJ SOLN
1.0000 mg | INTRAMUSCULAR | Status: DC
  Administered 2017-07-20: 2 mg via INTRAVENOUS

## 2017-07-20 MED ORDER — SUGAMMADEX SODIUM 200 MG/2ML IV SOLN: INTRAVENOUS | Status: DC | PRN

## 2017-07-20 MED ORDER — PROPOFOL 10 MG/ML IV BOLUS
INTRAVENOUS | Status: DC | PRN
  Administered 2017-07-20: 150 mg via INTRAVENOUS
  Administered 2017-07-20: 25 mg via INTRAVENOUS

## 2017-07-20 MED ORDER — ACETAMINOPHEN 325 MG PO TABS: 650.0000 mg | ORAL_TABLET | Freq: Four times a day (QID) | ORAL | Status: DC | PRN

## 2017-07-20 MED ORDER — ONDANSETRON HCL 4 MG/2ML IJ SOLN: 4.0000 mg | Freq: Four times a day (QID) | INTRAMUSCULAR | Status: DC | PRN

## 2017-07-20 MED ORDER — PROPOFOL 10 MG/ML IV BOLUS
INTRAVENOUS | Status: AC
  Filled 2017-07-20: qty 20

## 2017-07-20 MED ORDER — MIDAZOLAM HCL 2 MG/2ML IJ SOLN
INTRAMUSCULAR | Status: AC
  Filled 2017-07-20: qty 2

## 2017-07-20 MED ORDER — POVIDONE-IODINE 10 % OINT PACKET
TOPICAL_OINTMENT | CUTANEOUS | Status: DC | PRN
  Administered 2017-07-20: 1 via TOPICAL

## 2017-07-20 MED ORDER — DEXAMETHASONE SODIUM PHOSPHATE 4 MG/ML IJ SOLN
INTRAMUSCULAR | Status: AC
  Filled 2017-07-20: qty 1

## 2017-07-20 MED ORDER — SIMETHICONE 80 MG PO CHEW: 40.0000 mg | CHEWABLE_TABLET | Freq: Four times a day (QID) | ORAL | Status: DC | PRN

## 2017-07-20 MED ORDER — FENTANYL CITRATE (PF) 100 MCG/2ML IJ SOLN
INTRAMUSCULAR | Status: DC | PRN
  Administered 2017-07-20 (×5): 50 ug via INTRAVENOUS

## 2017-07-20 MED ORDER — KETOROLAC TROMETHAMINE 30 MG/ML IJ SOLN
30.0000 mg | Freq: Four times a day (QID) | INTRAMUSCULAR | Status: DC
  Filled 2017-07-20: qty 1

## 2017-07-20 MED ORDER — SODIUM CHLORIDE 0.9 % IR SOLN
Status: DC | PRN
  Administered 2017-07-20: 1000 mL

## 2017-07-20 MED ORDER — BUPIVACAINE HCL (PF) 0.5 % IJ SOLN
INTRAMUSCULAR | Status: AC
  Filled 2017-07-20: qty 30

## 2017-07-20 MED ORDER — PHENYLEPHRINE 40 MCG/ML (10ML) SYRINGE FOR IV PUSH (FOR BLOOD PRESSURE SUPPORT)
PREFILLED_SYRINGE | INTRAVENOUS | Status: AC
  Filled 2017-07-20: qty 10

## 2017-07-20 MED ORDER — DIPHENHYDRAMINE HCL 25 MG PO CAPS: 25.0000 mg | ORAL_CAPSULE | Freq: Four times a day (QID) | ORAL | Status: DC | PRN

## 2017-07-20 MED ORDER — FENTANYL CITRATE (PF) 100 MCG/2ML IJ SOLN
INTRAMUSCULAR | Status: AC
  Filled 2017-07-20: qty 2

## 2017-07-20 MED ORDER — BUPIVACAINE HCL (PF) 0.5 % IJ SOLN
INTRAMUSCULAR | Status: DC | PRN
  Administered 2017-07-20: 10 mL

## 2017-07-20 SURGICAL SUPPLY — 51 items
APPLIER CLIP ROT 10 11.4 M/L (STAPLE) ×3
APR CLP MED LRG 11.4X10 (STAPLE) ×1
BAG HAMPER (MISCELLANEOUS) ×3 IMPLANT
BAG RETRIEVAL 10 (BASKET) ×1
BAG RETRIEVAL 10MM (BASKET) ×1
CHLORAPREP W/TINT 26ML (MISCELLANEOUS) ×3 IMPLANT
CLIP APPLIE ROT 10 11.4 M/L (STAPLE) ×1 IMPLANT
CLOTH BEACON ORANGE TIMEOUT ST (SAFETY) ×3 IMPLANT
COVER LIGHT HANDLE STERIS (MISCELLANEOUS) ×6 IMPLANT
DECANTER SPIKE VIAL GLASS SM (MISCELLANEOUS) ×3 IMPLANT
ELECT REM PT RETURN 9FT ADLT (ELECTROSURGICAL) ×3
ELECTRODE REM PT RTRN 9FT ADLT (ELECTROSURGICAL) ×1 IMPLANT
GLOVE BIOGEL M 7.0 STRL (GLOVE) ×2 IMPLANT
GLOVE BIOGEL PI IND STRL 7.0 (GLOVE) ×1 IMPLANT
GLOVE BIOGEL PI INDICATOR 7.0 (GLOVE) ×6
GLOVE ECLIPSE 6.5 STRL STRAW (GLOVE) ×2 IMPLANT
GLOVE SURG SS PI 7.5 STRL IVOR (GLOVE) ×3 IMPLANT
GOWN STRL REUS W/ TWL XL LVL3 (GOWN DISPOSABLE) ×1 IMPLANT
GOWN STRL REUS W/TWL LRG LVL3 (GOWN DISPOSABLE) ×6 IMPLANT
GOWN STRL REUS W/TWL XL LVL3 (GOWN DISPOSABLE) ×3
HEMOSTAT SNOW SURGICEL 2X4 (HEMOSTASIS) ×3 IMPLANT
INST SET LAPROSCOPIC AP (KITS) ×3 IMPLANT
IV NS IRRIG 3000ML ARTHROMATIC (IV SOLUTION) IMPLANT
KIT TURNOVER KIT A (KITS) ×3 IMPLANT
MANIFOLD NEPTUNE II (INSTRUMENTS) ×3 IMPLANT
NDL HYPO 18GX1.5 BLUNT FILL (NEEDLE) ×1 IMPLANT
NDL HYPO 25X1 1.5 SAFETY (NEEDLE) ×1 IMPLANT
NEEDLE HYPO 18GX1.5 BLUNT FILL (NEEDLE) ×3 IMPLANT
NEEDLE HYPO 25X1 1.5 SAFETY (NEEDLE) ×3 IMPLANT
NEEDLE INSUFFLATION 14GA 120MM (NEEDLE) ×3 IMPLANT
NS IRRIG 1000ML POUR BTL (IV SOLUTION) ×3 IMPLANT
PACK LAP CHOLE LZT030E (CUSTOM PROCEDURE TRAY) ×3 IMPLANT
PAD ARMBOARD 7.5X6 YLW CONV (MISCELLANEOUS) ×3 IMPLANT
SET BASIN LINEN APH (SET/KITS/TRAYS/PACK) ×3 IMPLANT
SET TUBE IRRIG SUCTION NO TIP (IRRIGATION / IRRIGATOR) IMPLANT
SLEEVE ENDOPATH XCEL 5M (ENDOMECHANICALS) ×3 IMPLANT
SPONGE GAUZE 2X2 8PLY STER LF (GAUZE/BANDAGES/DRESSINGS) ×4
SPONGE GAUZE 2X2 8PLY STRL LF (GAUZE/BANDAGES/DRESSINGS) ×8 IMPLANT
STAPLER VISISTAT (STAPLE) ×3 IMPLANT
SUT VICRYL 0 UR6 27IN ABS (SUTURE) ×3 IMPLANT
SYR 20CC LL (SYRINGE) ×6 IMPLANT
SYS BAG RETRIEVAL 10MM (BASKET) ×1
SYSTEM BAG RETRIEVAL 10MM (BASKET) ×1 IMPLANT
TAPE CLOTH SURG 4X10 WHT LF (GAUZE/BANDAGES/DRESSINGS) ×3 IMPLANT
TROCAR ENDO BLADELESS 11MM (ENDOMECHANICALS) ×3 IMPLANT
TROCAR XCEL NON-BLD 5MMX100MML (ENDOMECHANICALS) ×3 IMPLANT
TROCAR XCEL UNIV SLVE 11M 100M (ENDOMECHANICALS) ×3 IMPLANT
TUBE CONNECTING 12'X1/4 (SUCTIONS) ×1
TUBE CONNECTING 12X1/4 (SUCTIONS) ×2 IMPLANT
TUBING INSUFFLATION (TUBING) ×3 IMPLANT
WARMER LAPAROSCOPE (MISCELLANEOUS) ×3 IMPLANT

## 2017-07-20 NOTE — Discharge Summary (Signed)
Physician Discharge Summary  Patient ID: Tricia Castaneda MRN: [id number] DOB/AGE: [birth year] 37 y.o.  Admit date: 07/19/2017 Discharge date: 07/20/2017  Admission Diagnoses: Acute cholecystitis, cholelithiasis  Discharge Diagnoses: Same Active Problems:   Acute cholecystitis   Cholecystitis, acute with cholelithiasis   Discharged Condition: good  Hospital Course: Patient is a 37yo white female who presented to the ER with a 24 h/o worsening right upper quadrant abdominal pain.  CT scan of the abdomen revealed acute cholecystitis with cholelithiasis.  She underwent a laparoscopic cholecystectomy on 07/20/17.  She tolerated the procedure well.  Her postoperative course has been unremarkable.  She is being discharge home on 07/20/17 in good and improving condition.  Treatments: surgery: Laparoscopic cholecystectomy on 07/20/17  Discharge Exam: Blood pressure (!) 151/88, pulse 71, temperature 99.3 F (37.4 C), temperature source Oral, resp. rate 18, height 5\' 5"  (1.651 m), weight 192 lb 3.9 oz (87.2 kg), SpO2 96 %. General appearance: alert, cooperative and no distress Resp: clear to auscultation bilaterally Cardio: regular rate and rhythm, S1, S2 normal, no murmur, click, rub or gallop GI: soft, incisions healing well.  Disposition: Home  Discharge Instructions    Diet general   Complete by:  As directed    Increase activity slowly   Complete by:  As directed      Allergies as of 07/20/2017   No Known Allergies     Medication List    STOP taking these medications   alum & mag hydroxide-simeth 200-200-20 MG/5ML suspension Commonly known as:  MAALOX/MYLANTA   amoxicillin-clavulanate 875-125 MG tablet Commonly known as:  AUGMENTIN     TAKE these medications   BLISOVI FE 1.5/30 1.5-30 MG-MCG tablet Generic drug:  norethindrone-ethinyl estradiol-iron Take 1 tablet by mouth daily.   fluticasone 50 MCG/ACT nasal spray Commonly known as:  FLONASE Place 2 sprays into both nostrils  daily. What changed:    when to take this  reasons to take this   HYDROcodone-acetaminophen 5-325 MG tablet Commonly known as:  NORCO Take 1 tablet by mouth every 4 (four) hours as needed for moderate pain.   ibuprofen 200 MG tablet Commonly known as:  ADVIL,MOTRIN Take 400 mg by mouth every 6 (six) hours as needed.   ondansetron 4 MG disintegrating tablet Commonly known as:  ZOFRAN ODT Take 1 tablet (4 mg total) by mouth every 8 (eight) hours as needed for nausea or vomiting.   tretinoin 0.1 % cream Commonly known as:  RETIN-A tretinoin 0.1 % topical cream  APPLY TO THE AFFECTED AREA(S) BY TOPICAL ROUTE ONCE DAILY AT BEDTIME      Follow-up Information    Franky MachoJenkins, Druscilla Petsch, MD. Schedule an appointment as soon as possible for a visit on 07/27/2017.   Specialty:  General Surgery Contact information: [address] [place] [address] [phone]           Signed: Franky MachoMark Catalena Stanhope 07/20/2017, 12:25 PM

## 2017-07-20 NOTE — Anesthesia Procedure Notes (Signed)
Procedure Name: Intubation Date/Time: 07/20/2017 7:42 AM Performed by: Pernell DupreAdams, Amy A, CRNA Pre-anesthesia Checklist: Patient identified, Patient being monitored, Timeout performed, Emergency Drugs available and Suction available Patient Re-evaluated:Patient Re-evaluated prior to induction Oxygen Delivery Method: Circle System Utilized Preoxygenation: Pre-oxygenation with 100% oxygen Induction Type: IV induction Ventilation: Mask ventilation without difficulty Laryngoscope Size: Glidescope and 3 Grade View: Grade I Tube type: Oral Tube size: 7.0 mm Number of attempts: 1 Airway Equipment and Method: Video-laryngoscopy Placement Confirmation: ETT inserted through vocal cords under direct vision,  positive ETCO2 and breath sounds checked- equal and bilateral Secured at: 21 cm Tube secured with: Tape Dental Injury: Teeth and Oropharynx as per pre-operative assessment

## 2017-07-20 NOTE — Anesthesia Postprocedure Evaluation (Signed)
Anesthesia Post Note  Patient: Tricia Castaneda  Procedure(s) Performed: LAPAROSCOPIC CHOLECYSTECTOMY (N/A )  Patient location during evaluation: Nursing Unit Anesthesia Type: General Level of consciousness: awake and alert, oriented and patient cooperative Pain management: pain level controlled Vital Signs Assessment: post-procedure vital signs reviewed and stable Respiratory status: spontaneous breathing and respiratory function stable Cardiovascular status: stable Postop Assessment: no apparent nausea or vomiting Anesthetic complications: no     Last Vitals:  Vitals:   07/20/17 0830 07/20/17 0845  BP: (!) 140/92 (!) 145/99  Pulse: 73 71  Resp: (!) 23 (!) 22  Temp: 37.1 C   SpO2: 92% 95%    Last Pain:  Vitals:   07/20/17 0845  TempSrc:   PainSc: 3                  ADAMS, AMY A

## 2017-07-20 NOTE — Progress Notes (Signed)
PT left floor to head to OR for choley

## 2017-07-20 NOTE — Anesthesia Preprocedure Evaluation (Addendum)
Anesthesia Evaluation  Patient identified by MRN, date of birth, ID band Patient awake    Reviewed: Allergy & Precautions, NPO status , Patient's Chart, lab work & pertinent test results  History of Anesthesia Complications (+) history of anesthetic complications ( high spinal? during C-section 2008 )  Airway Mallampati: III  TM Distance: <3 FB Neck ROM: Full    Dental  (+) Teeth Intact   Pulmonary former smoker,    breath sounds clear to auscultation       Cardiovascular negative cardio ROS   Rhythm:Regular Rate:Normal     Neuro/Psych negative neurological ROS  negative psych ROS   GI/Hepatic GERD  Controlled,  Endo/Other  negative endocrine ROS  Renal/GU negative Renal ROS     Musculoskeletal   Abdominal   Peds  Hematology negative hematology ROS (+)   Anesthesia Other Findings Acute chole, N-V  Reproductive/Obstetrics                            Anesthesia Physical Anesthesia Plan  ASA: I and emergent  Anesthesia Plan: General   Post-op Pain Management:    Induction: Intravenous, Rapid sequence and Cricoid pressure planned  PONV Risk Score and Plan:   Airway Management Planned: Oral ETT and Video Laryngoscope Planned  Additional Equipment:   Intra-op Plan:   Post-operative Plan: Extubation in OR  Informed Consent: I have reviewed the patients History and Physical, chart, labs and discussed the procedure including the risks, benefits and alternatives for the proposed anesthesia with the patient or authorized representative who has indicated his/her understanding and acceptance.     Plan Discussed with:   Anesthesia Plan Comments:         Anesthesia Quick Evaluation

## 2017-07-20 NOTE — Op Note (Signed)
Patient:  Tricia Castaneda  DOB:  08-May-1981  MRN:  [id number]   Preop Diagnosis: Acute cholecystitis, cholelithiasis  Postop Diagnosis: Same  Procedure: Laparoscopic cholecystectomy  Surgeon: Franky MachoMark Shanaya Schneck, MD  Anes: General endotracheal  Indications: Patient is a 37 year old white female who presents with acute cholecystitis secondary to cholelithiasis.  The risks and benefits of the procedure including bleeding, infection, hepatobiliary injury, and the possibility of an open procedure were fully explained to the patient, who gave informed consent.  Procedure note: The patient was placed in supine position.  After induction of general endotracheal anesthesia, the abdomen was prepped and draped using the usual sterile technique with DuraPrep.  Surgical site confirmation was performed.  Supraumbilical incision was made down to the fascia.  A Veress needle was introduced into the abdominal cavity and confirmation of placement was done using saline drop test.  The abdomen was then insufflated to 16 mmHg pressure.  An 11 mm trocar was introduced into the abdominal cavity under direct visualization without difficulty.  The patient was placed in reverse Trendelenburg position and an additional 11 mm trocar was placed in the epigastric region and 5 mm trochars were placed in the right upper quadrant and right flank regions.  Liver was inspected and noted to be within normal limits.  The gallbladder was noted to be acutely inflamed and distended.  A gallstone was noted to be impacted in the neck of the gallbladder.  The gallbladder was decompressed in order to facilitate dynamic retraction to expose the triangle of Calot.  The cystic duct was first identified.  Its juncture to the infundibulum was fully identified.  Endoclips were placed proximally and distally on the cystic duct, the duct was divided.  This was likewise done to the cystic artery.  The gallbladder was freed away from the gallbladder fossa  using Bovie electrocautery.  Gallbladder was delivered through the epigastric trocar site using an Endo Catch bag.  The gallbladder fossa was inspected and no abnormal bleeding or bile leakage was noted.  Surgicel was placed in the gallbladder fossa.  All fluid and air were then evacuated from the abdominal cavity prior to removal of the trochars.  All wounds were irrigated with normal saline.  The wounds were injected with 0.5% Sensorcaine.  The epigastric fascia as well as the supraumbilical fascia was reapproximated using 0 Vicryl interrupted sutures.  All skin incisions were closed using staples.  Betadine ointment and dry sterile dressings were applied.  All tape and needle counts were correct at the end of the procedure.  Patient was extubated in the operating room and transferred to PACU in stable condition.  Complications: None  EBL: Minimal  Specimen: Gallbladder

## 2017-07-20 NOTE — Transfer of Care (Signed)
Immediate Anesthesia Transfer of Care Note  Patient: Jacinta L Hopper  Procedure(s) Performed: LAPAROSCOPIC CHOLECYSTECTOMY (N/A )  Patient Location: PACU  Anesthesia Type:General  Level of Consciousness: awake, oriented and patient cooperative  Airway & Oxygen Therapy: Patient Spontanous Breathing  Post-op Assessment: Report given to RN and Post -op Vital signs reviewed and stable  Post vital signs: Reviewed and stable  Last Vitals:  Vitals:   07/20/17 0715 07/20/17 0730  BP: (!) 146/94   Pulse:    Resp: (!) 21 14  Temp:    SpO2: 97% 97%    Last Pain:  Vitals:   07/20/17 0638  TempSrc: Oral  PainSc:       Patients Stated Pain Goal: 0 (07/19/17 2254)  Complications: No apparent anesthesia complications

## 2017-07-20 NOTE — Discharge Instructions (Signed)
Laparoscopic Cholecystectomy, Care After °This sheet gives you information about how to care for yourself after your procedure. Your health care provider may also give you more specific instructions. If you have problems or questions, contact your health care provider. °What can I expect after the procedure? °After the procedure, it is common to have: °· Pain at your incision sites. You will be given medicines to control this pain. °· Mild nausea or vomiting. °· Bloating and possible shoulder pain from the air-like gas that was used during the procedure. ° °Follow these instructions at home: °Incision care ° °· Follow instructions from your health care provider about how to take care of your incisions. Make sure you: °? Wash your hands with soap and water before you change your bandage (dressing). If soap and water are not available, use hand sanitizer. °? Change your dressing as told by your health care provider. °? Leave stitches (sutures), skin glue, or adhesive strips in place. These skin closures may need to be in place for 2 weeks or longer. If adhesive strip edges start to loosen and curl up, you may trim the loose edges. Do not remove adhesive strips completely unless your health care provider tells you to do that. °· Do not take baths, swim, or use a hot tub until your health care provider approves. Ask your health care provider if you can take showers. You may only be allowed to take sponge baths for bathing. °· Check your incision area every day for signs of infection. Check for: °? More redness, swelling, or pain. °? More fluid or blood. °? Warmth. °? Pus or a bad smell. °Activity °· Do not drive or use heavy machinery while taking prescription pain medicine. °· Do not lift anything that is heavier than 10 lb (4.5 kg) until your health care provider approves. °· Do not play contact sports until your health care provider approves. °· Do not drive for 24 hours if you were given a medicine to help you relax  (sedative). °· Rest as needed. Do not return to work or school until your health care provider approves. °General instructions °· Take over-the-counter and prescription medicines only as told by your health care provider. °· To prevent or treat constipation while you are taking prescription pain medicine, your health care provider may recommend that you: °? Drink enough fluid to keep your urine clear or pale yellow. °? Take over-the-counter or prescription medicines. °? Eat foods that are high in fiber, such as fresh fruits and vegetables, whole grains, and beans. °? Limit foods that are high in fat and processed sugars, such as fried and sweet foods. °Contact a health care provider if: °· You develop a rash. °· You have more redness, swelling, or pain around your incisions. °· You have more fluid or blood coming from your incisions. °· Your incisions feel warm to the touch. °· You have pus or a bad smell coming from your incisions. °· You have a fever. °· One or more of your incisions breaks open. °Get help right away if: °· You have trouble breathing. °· You have chest pain. °· You have increasing pain in your shoulders. °· You faint or feel dizzy when you stand. °· You have severe pain in your abdomen. °· You have nausea or vomiting that lasts for more than one day. °· You have leg pain. °This information is not intended to replace advice given to you by your health care provider. Make sure you discuss any questions you   have with your health care provider. °Document Released: 05/04/2005 Document Revised: 11/23/2015 Document Reviewed: 10/21/2015 °[employer] Interactive Patient Education © 2018 [employer] ° °

## 2017-07-20 NOTE — H&P (Signed)
Tricia Castaneda is an 37 y.o. female.   Chief Complaint: Right upper quadrant abdominal pain HPI: Patient is a 37 year old white female who presented to the emergency room with a 24-hour history of worsening right upper quadrant abdominal pain and nausea.  She thinks this was her first episode.  Evaluation in the emergency room revealed cholecystitis with cholelithiasis.  She was admitted to the hospital for further evaluation and treatment.  She states this morning her right upper quadrant abdominal pain has eased.  It is a 2 out of 10.  Her nausea has resolved.  She denies any fever, chills, or jaundice.  Past Medical History:  Diagnosis Date  . Complication of anesthesia    spinal anesthesia - unknown reaction - Epi pen used    Past Surgical History:  Procedure Laterality Date  . CESAREAN SECTION      History reviewed. No pertinent family history. Social History:  reports that she quit smoking about 18 months ago. she has never used smokeless tobacco. She reports that she drinks alcohol. She reports that she does not use drugs.  Allergies: No Known Allergies  Medications Prior to Admission  Medication Sig Dispense Refill  . alum & mag hydroxide-simeth (MAALOX/MYLANTA) 200-200-20 MG/5ML suspension Take 30 mLs by mouth every 6 (six) hours as needed for indigestion or heartburn.    Marland Kitchen BLISOVI FE 1.5/30 1.5-30 MG-MCG tablet Take 1 tablet by mouth daily.     . fluticasone (FLONASE) 50 MCG/ACT nasal spray Place 2 sprays into both nostrils daily. (Patient taking differently: Place 2 sprays into both nostrils daily as needed for allergies. ) 16 g 6  . ibuprofen (ADVIL,MOTRIN) 200 MG tablet Take 400 mg by mouth every 6 (six) hours as needed.    . tretinoin (RETIN-A) 0.1 % cream tretinoin 0.1 % topical cream  APPLY TO THE AFFECTED AREA(S) BY TOPICAL ROUTE ONCE DAILY AT BEDTIME    . amoxicillin-clavulanate (AUGMENTIN) 875-125 MG tablet Take 1 tablet by mouth 2 (two) times daily. (Patient not  taking: Reported on 07/19/2017) 14 tablet 0    Results for orders placed or performed during the hospital encounter of 07/19/17 (from the past 48 hour(s))  Lipase, blood     Status: None   Collection Time: 07/19/17  3:28 PM  Result Value Ref Range   Lipase 27 11 - 51 U/L    Comment: Performed at [facility], [address], [place], [place] [address]  Comprehensive metabolic panel     Status: Abnormal   Collection Time: 07/19/17  3:28 PM  Result Value Ref Range   Sodium 135 135 - 145 mmol/L   Potassium 3.7 3.5 - 5.1 mmol/L   Chloride 98 (L) 101 - 111 mmol/L   CO2 21 (L) 22 - 32 mmol/L   Glucose, Bld 115 (H) 65 - 99 mg/dL   BUN 11 6 - 20 mg/dL   Creatinine, Ser 0.70 0.44 - 1.00 mg/dL   Calcium 8.6 (L) 8.9 - 10.3 mg/dL   Total Protein 8.0 6.5 - 8.1 g/dL   Albumin 4.4 3.5 - 5.0 g/dL   AST 22 15 - 41 U/L   ALT 16 14 - 54 U/L   Alkaline Phosphatase 41 38 - 126 U/L   Total Bilirubin 0.4 0.3 - 1.2 mg/dL   GFR calc non Af Amer >60 >60 mL/min   GFR calc Af Amer >60 >60 mL/min    Comment: (NOTE) The eGFR has been calculated using the CKD EPI equation. This calculation has not  been validated in all clinical situations. eGFR's persistently <60 mL/min signify possible Chronic Kidney Disease.    Anion gap 16 (H) 5 - 15    Comment: Performed at [facility], [address] [place] [address], [place], [place] [address]  CBC     Status: Abnormal   Collection Time: 07/19/17  3:28 PM  Result Value Ref Range   WBC 14.7 (H) 4.0 - 10.5 K/uL   RBC 4.46 3.87 - 5.11 MIL/uL   Hemoglobin 13.3 12.0 - 15.0 g/dL   HCT 40.3 36.0 - 46.0 %   MCV 90.4 78.0 - 100.0 fL   MCH 29.8 26.0 - 34.0 pg   MCHC 33.0 30.0 - 36.0 g/dL   RDW 13.0 11.5 - 15.5 %   Platelets 346 150 - 400 K/uL    Comment: Performed at [facility], [address], [place], [place] [address]  hCG, quantitative, pregnancy     Status: None   Collection Time: 07/19/17  3:30 PM  Result Value Ref Range   hCG, Beta Chain, Quant, S <1 <5 mIU/mL    Comment:           GEST. AGE      CONC.  (mIU/mL)   <=1 WEEK        5 - 50     2 WEEKS       50 - 500     3 WEEKS       100 - 10,000     4 WEEKS     1,000 - 30,000     5 WEEKS     3,500 - 115,000   6-8 WEEKS     12,000 - 270,000    12 WEEKS     15,000 - 220,000        FEMALE AND NON-PREGNANT FEMALE:     LESS THAN 5 mIU/mL Performed at [facility], [address] [place], [place] [address]   Urinalysis, Routine w reflex microscopic     Status: Abnormal   Collection Time: 07/19/17  5:55 PM  Result Value Ref Range   Color, Urine STRAW (A) YELLOW   APPearance CLEAR CLEAR   Specific Gravity, Urine 1.005 1.005 - 1.030   pH 6.0 5.0 - 8.0   Glucose, UA NEGATIVE NEGATIVE mg/dL   Hgb urine dipstick NEGATIVE NEGATIVE   Bilirubin Urine NEGATIVE NEGATIVE   Ketones, ur 5 (A) NEGATIVE mg/dL   Protein, ur NEGATIVE NEGATIVE mg/dL   Nitrite NEGATIVE NEGATIVE   Leukocytes, UA NEGATIVE NEGATIVE    Comment: Performed at [facility], [address] [place], [place] [address]  Surgical PCR screen     Status: None   Collection Time: 07/20/17 12:00 AM  Result Value Ref Range   MRSA, PCR NEGATIVE NEGATIVE   Staphylococcus aureus NEGATIVE NEGATIVE    Comment: (NOTE) The Xpert SA Assay (FDA approved for NASAL specimens in patients 94 years of age and older), is one component of a comprehensive surveillance program. It is not intended to diagnose infection nor to guide or monitor treatment. Performed at [facility], [address], [place], [place] [address]   Basic metabolic panel     Status: Abnormal   Collection Time: 07/20/17  6:16 AM  Result Value Ref Range   Sodium 136 135 - 145 mmol/L   Potassium 3.1 (L) 3.5 - 5.1 mmol/L   Chloride 103 101 - 111 mmol/L   CO2 23 22 - 32 mmol/L   Glucose, Bld 128 (H) 65 - 99 mg/dL  BUN 8 6 - 20 mg/dL   Creatinine, Ser 0.75 0.44 - 1.00 mg/dL   Calcium 7.9 (L) 8.9 - 10.3 mg/dL   GFR calc non Af Amer >60 >60 mL/min   GFR calc Af Amer >60 >60 mL/min    Comment:  (NOTE) The eGFR has been calculated using the CKD EPI equation. This calculation has not been validated in all clinical situations. eGFR's persistently <60 mL/min signify possible Chronic Kidney Disease.    Anion gap 10 5 - 15    Comment: Performed at [facility], [address], [place], [place] [address]  CBC     Status: None   Collection Time: 07/20/17  6:16 AM  Result Value Ref Range   WBC 9.3 4.0 - 10.5 K/uL   RBC 4.09 3.87 - 5.11 MIL/uL   Hemoglobin 12.3 12.0 - 15.0 g/dL   HCT 37.0 36.0 - 46.0 %   MCV 90.5 78.0 - 100.0 fL   MCH 30.1 26.0 - 34.0 pg   MCHC 33.2 30.0 - 36.0 g/dL   RDW 13.2 11.5 - 15.5 %   Platelets 280 150 - 400 K/uL    Comment: Performed at [facility], [address] [place], [place] [address]  Glucose, capillary     Status: Abnormal   Collection Time: 07/20/17  6:17 AM  Result Value Ref Range   Glucose-Capillary 115 (H) 65 - 99 mg/dL   Ct Abdomen Pelvis W Contrast  Result Date: 07/19/2017 CLINICAL DATA:  Right upper quadrant abdominal pain EXAM: CT ABDOMEN AND PELVIS WITH CONTRAST TECHNIQUE: Multidetector CT imaging of the abdomen and pelvis was performed using the standard protocol following bolus administration of intravenous contrast. CONTRAST:  179m ISOVUE-300 IOPAMIDOL (ISOVUE-300) INJECTION 61% COMPARISON:  None. FINDINGS: Lower chest: No acute abnormality. Hepatobiliary: No focal hepatic abnormality. Large 3 cm calcified stone in the gallbladder neck. Gallbladder wall thickening and pericholecystic fluid. No biliary dilatation Pancreas: Unremarkable. No pancreatic ductal dilatation or surrounding inflammatory changes. Spleen: Normal in size without focal abnormality. Adrenals/Urinary Tract: Adrenal glands are within normal limits. Subcentimeter hypodensities in the kidneys too small to further characterize. The bladder is normal. Stomach/Bowel: Stomach is within normal limits. Appendix appears normal. No evidence of bowel wall thickening, distention, or  inflammatory changes. Vascular/Lymphatic: No significant vascular findings are present. No enlarged abdominal or pelvic lymph nodes. Reproductive: Uterus and bilateral adnexa are unremarkable. Other: Negative for free air or free fluid. Small fat in the umbilical region. Musculoskeletal: No acute or significant osseous findings. IMPRESSION: 1. Large calcified gallstone in the gallbladder neck with gallbladder wall thickening and pericholecystic fluid; collective findings are suspicious for an acute cholecystitis. No biliary dilatation is seen. 2. There are no other acute abnormalities visualized. Electronically Signed   By: KDonavan FoilM.D.   On: 07/19/2017 19:13    Review of Systems  Constitutional: Positive for malaise/fatigue.  HENT: Negative.   Eyes: Negative.   Respiratory: Negative.   Cardiovascular: Negative.   Gastrointestinal: Positive for abdominal pain and heartburn.  Genitourinary: Negative.   Musculoskeletal: Negative.   Skin: Negative.   Neurological: Negative.   Endo/Heme/Allergies: Negative.   Psychiatric/Behavioral: Negative.     Blood pressure 124/82, pulse 78, temperature 98.7 F (37.1 C), temperature source Oral, resp. rate 16, height '5\' 5"'$  (1.651 m), weight 192 lb 3.9 oz (87.2 kg), SpO2 100 %. Physical Exam  Vitals reviewed. Constitutional: She is oriented to person, place, and time. She appears well-developed and well-nourished.  HENT:  Head: Normocephalic and atraumatic.  Eyes: No scleral icterus.  Cardiovascular: Normal rate, regular rhythm and normal heart sounds. Exam reveals no gallop and no friction rub.  No murmur heard. Respiratory: Effort normal and breath sounds normal. No respiratory distress. She has no wheezes. She has no rales.  GI: Soft. Bowel sounds are normal. She exhibits no distension. There is tenderness. There is no rebound and no guarding.  Slightly tender in the right upper quadrant to palpation.  No rigidity noted.  Neurological: She is  alert and oriented to person, place, and time.  Skin: Skin is warm and dry.    CT scan report reviewed Assessment/Plan Impression: Acute cholecystitis, cholelithiasis Plan: Patient will undergo laparoscopic cholecystectomy today.  The risks and benefits of the procedure including bleeding, infection, hepatobiliary injury, and the possibility of an open procedure were fully explained to the patient, who gave informed consent.  Aviva Signs, MD 07/20/2017, 6:53 AM

## 2017-07-21 ENCOUNTER — Encounter (HOSPITAL_COMMUNITY): Payer: Self-pay | Admitting: General Surgery

## 2017-07-21 LAB — HIV ANTIBODY (ROUTINE TESTING W REFLEX): HIV Screen 4th Generation wRfx: NONREACTIVE

## 2017-07-27 ENCOUNTER — Ambulatory Visit (INDEPENDENT_AMBULATORY_CARE_PROVIDER_SITE_OTHER): Payer: Self-pay | Admitting: General Surgery

## 2017-07-27 ENCOUNTER — Encounter: Payer: Self-pay | Admitting: General Surgery

## 2017-07-27 VITALS — BP 135/84 | HR 82 | Temp 98.7°F | Ht 65.0 in | Wt 190.0 lb

## 2017-07-27 DIAGNOSIS — Z09 Encounter for follow-up examination after completed treatment for conditions other than malignant neoplasm: Secondary | ICD-10-CM

## 2017-07-27 NOTE — Progress Notes (Signed)
Subjective:     Tricia Castaneda  Status post laparoscopic cholecystectomy.  Doing well.  Minimal incisional pain. Objective:    BP 135/84   Pulse 82   Temp 98.7 F (37.1 C)   Ht 5\' 5"  (1.651 m)   Wt 190 lb (86.2 kg)   BMI 31.62 kg/m   General:  alert, cooperative and no distress  Abdomen soft, incisions healing well.  Staples removed, Steri-Strips applied. Final pathology consistent with diagnosis.     Assessment:    Doing well postoperatively.    Plan:   Increase activity as able.  Follow-up here as needed.

## 2017-10-25 ENCOUNTER — Encounter: Payer: Self-pay | Admitting: Family

## 2017-10-25 ENCOUNTER — Ambulatory Visit (INDEPENDENT_AMBULATORY_CARE_PROVIDER_SITE_OTHER): Payer: PRIVATE HEALTH INSURANCE | Admitting: Family

## 2017-10-25 VITALS — BP 131/95 | HR 72 | Temp 99.0°F | Ht 65.0 in | Wt 191.2 lb

## 2017-10-25 DIAGNOSIS — Z713 Dietary counseling and surveillance: Secondary | ICD-10-CM | POA: Diagnosis not present

## 2017-10-25 DIAGNOSIS — I10 Essential (primary) hypertension: Secondary | ICD-10-CM

## 2017-10-25 DIAGNOSIS — E669 Obesity, unspecified: Secondary | ICD-10-CM | POA: Diagnosis not present

## 2017-10-25 MED ORDER — TOPIRAMATE 25 MG PO TABS: ORAL_TABLET | ORAL | 0 refills | Status: DC

## 2017-10-25 MED ORDER — PHENTERMINE HCL 37.5 MG PO CAPS
37.5000 mg | ORAL_CAPSULE | ORAL | 2 refills | Status: DC
Start: 2017-10-25 — End: 2017-11-24

## 2017-10-25 NOTE — Patient Instructions (Signed)

## 2017-10-25 NOTE — Progress Notes (Signed)
Subjective:    Patient ID: Tricia Castaneda, female    DOB: 01-29-1981, 37 y.o.   MRN: [id number]  Chief Complaint  Patient presents with  . discuss weight loss   PT presents to the office today to discuss weigh loss medication. Pt states she quit smoking last year and has gained 40 lbs. States she has also changed jobs in which she is not as active as she had been in the past.   She reports she has tried phentermine and does not work for her. PT states she has tried diet and exercise without success.   Pt's BP is elevated today. States she takes her BP at home and her BP is never elevated at home. States she has been prescribed BP medication in the past and her BP dropped on 80's/70's at home. States she will monitor her BP and send her log through MyChart.  Hypertension  This is a new problem. The current episode started 1 to 4 weeks ago. Associated symptoms include malaise/fatigue. Pertinent negatives include no headaches, peripheral edema or shortness of breath.      Review of Systems  Constitutional: Positive for malaise/fatigue.  Respiratory: Negative for shortness of breath.   Neurological: Negative for headaches.  All other systems reviewed and are negative.      Objective:   Physical Exam  Constitutional: She is oriented to person, place, and time. She appears well-developed and well-nourished. No distress.  HENT:  Head: Normocephalic and atraumatic.  Right Ear: External ear normal.  Mouth/Throat: Oropharynx is clear and moist.  Eyes: Pupils are equal, round, and reactive to light.  Neck: Normal range of motion. Neck supple. No thyromegaly present.  Cardiovascular: Normal rate, regular rhythm, normal heart sounds and intact distal pulses.  No murmur heard. Pulmonary/Chest: Effort normal and breath sounds normal. No respiratory distress. She has no wheezes.  Abdominal: Soft. Bowel sounds are normal. She exhibits no distension. There is no tenderness.  Musculoskeletal:  Normal range of motion. She exhibits no edema or tenderness.  Neurological: She is alert and oriented to person, place, and time. She has normal reflexes. No cranial nerve deficit.  Skin: Skin is warm and dry.  Psychiatric: She has a normal mood and affect. Her behavior is normal. Judgment and thought content normal.  Vitals reviewed.     BP (!) 138/94   Pulse 77   Temp 99 F (37.2 C) (Oral)   Ht '5\' 5"'$  (1.651 m)   Wt 191 lb 3.2 oz (86.7 kg)   BMI 31.82 kg/m      Assessment & Plan:  1. Weight loss counseling, encounter for Will start phentermine and topamax today Will start topamax at 25 mg and gradually increase to 50 mg, follow up in 1 month and we will increase to 75 mg - phentermine 37.5 MG capsule; Take 1 capsule (37.5 mg total) by mouth every morning.  Dispense: 30 capsule; Refill: 2 - topiramate (TOPAMAX) 25 MG tablet; Take 1 tablet (25 mg total) by mouth daily for 3 days, THEN 2 tablets (50 mg total) daily for 7 days.  Dispense: 90 tablet; Refill: 0 - BMP8+EGFR  2. Essential hypertension Pt will do BP at home and BP log given. She will send [place] copy through MyChart Low salt diet -Dash diet information given -Exercise encouraged - Stress Management  - BMP8+EGFR  3. Obesity (BMI 30-39.9) - phentermine 37.5 MG capsule; Take 1 capsule (37.5 mg total) by mouth every morning.  Dispense: 30 capsule; Refill: 2 -  topiramate (TOPAMAX) 25 MG tablet; Take 1 tablet (25 mg total) by mouth daily for 3 days, THEN 2 tablets (50 mg total) daily for 7 days.  Dispense: 90 tablet; Refill: 0 - BMP8+EGFR   Labs pending If BP continues to be elevated we will not be able to continue phentermine  She must lose 5% of weight to continue medication at the 3 month follow up RTO in 1 month  Evelina Dun, FNP

## 2017-10-26 ENCOUNTER — Encounter: Payer: Self-pay | Admitting: Family

## 2017-10-26 LAB — BMP8+EGFR
BUN / CREAT RATIO: 18 (ref 9–23)
BUN: 12 mg/dL (ref 6–20)
CO2: 19 mmol/L — AB (ref 20–29)
Calcium: 9 mg/dL (ref 8.7–10.2)
Chloride: 103 mmol/L (ref 96–106)
Creatinine, Ser: 0.67 mg/dL (ref 0.57–1.00)
GFR calc Af Amer: 128 mL/min/{1.73_m2} (ref >59)
GFR calc non Af Amer: 111 mL/min/{1.73_m2} (ref >59)
GLUCOSE: 93 mg/dL (ref 65–99)
Potassium: 4.3 mmol/L (ref 3.5–5.2)
Sodium: 139 mmol/L (ref 134–144)

## 2017-11-24 ENCOUNTER — Ambulatory Visit (INDEPENDENT_AMBULATORY_CARE_PROVIDER_SITE_OTHER): Payer: PRIVATE HEALTH INSURANCE | Admitting: Family

## 2017-11-24 ENCOUNTER — Encounter: Payer: Self-pay | Admitting: Family

## 2017-11-24 DIAGNOSIS — Z713 Dietary counseling and surveillance: Secondary | ICD-10-CM | POA: Diagnosis not present

## 2017-11-24 DIAGNOSIS — E669 Obesity, unspecified: Secondary | ICD-10-CM | POA: Diagnosis not present

## 2017-11-24 MED ORDER — TOPIRAMATE 50 MG PO TABS: 50.0000 mg | ORAL_TABLET | Freq: Two times a day (BID) | ORAL | 1 refills | Status: DC

## 2017-11-24 MED ORDER — PHENTERMINE HCL 37.5 MG PO CAPS: 37.5000 mg | ORAL_CAPSULE | ORAL | 2 refills | Status: DC

## 2017-11-24 NOTE — Progress Notes (Signed)
   Subjective:    Patient ID: Tricia Castaneda, female    DOB: 07-19-1980, 37 y.o.   MRN: [id number]  Chief Complaint  Patient presents with  . Weight Check    HPI Pt presents to the office today for weight loss follow up. Pt was seen in the office on 10/25/17 and started on phentermine and Topamax and  has lost 4 pounds. She reports she has only been able to start exercising in the last two weeks because of the rain.   She reports she has "cut  Back" on her portions and tying to become more active.   She has been monitoring her BP at home and her average has been 115/80. She has bought in her BP machine today    Review of Systems  All other systems reviewed and are negative.      Objective:   Physical Exam  Constitutional: She is oriented to person, place, and time. She appears well-developed and well-nourished. No distress.  HENT:  Head: Normocephalic and atraumatic.  Right Ear: External ear normal.  Left Ear: External ear normal.  Mouth/Throat: Oropharynx is clear and moist.  Eyes: Pupils are equal, round, and reactive to light.  Neck: Normal range of motion. Neck supple. No thyromegaly present.  Cardiovascular: Normal rate, regular rhythm, normal heart sounds and intact distal pulses.  No murmur heard. Pulmonary/Chest: Effort normal and breath sounds normal. No respiratory distress. She has no wheezes.  Abdominal: Soft. Bowel sounds are normal. She exhibits no distension. There is no tenderness.  Musculoskeletal: Normal range of motion. She exhibits no edema or tenderness.  Neurological: She is alert and oriented to person, place, and time. She has normal reflexes. No cranial nerve deficit.  Skin: Skin is warm and dry.  Psychiatric: She has a normal mood and affect. Her behavior is normal. Judgment and thought content normal.  Vitals reviewed.     BP (!) 135/93   Pulse 90   Temp 98.1 F (36.7 C) (Oral)   Ht '5\' 5"'$  (1.651 m)   Wt 187 lb 9.6 oz (85.1 kg)   BMI 31.22  kg/m      Assessment & Plan:  Tricia Castaneda was seen today for weight check.  Diagnoses and all orders for this visit:  Weight loss counseling, encounter for -     CMP14+EGFR -     phentermine 37.5 MG capsule; Take 1 capsule (37.5 mg total) by mouth every morning.  Obesity (BMI 30-39.9) -     CMP14+EGFR -     phentermine 37.5 MG capsule; Take 1 capsule (37.5 mg total) by mouth every morning.  Other orders -     topiramate (TOPAMAX) 50 MG tablet; Take 1 tablet (50 mg total) by mouth 2 (two) times daily.   Healthy diet and exercise Home BP is at goal RTO 3 months   Evelina Dun, FNP

## 2017-11-24 NOTE — Patient Instructions (Signed)

## 2017-11-25 LAB — CMP14+EGFR
ALT: 16 IU/L (ref 0–32)
AST: 18 IU/L (ref 0–40)
Albumin/Globulin Ratio: 1.8 (ref 1.2–2.2)
Albumin: 4.4 g/dL (ref 3.5–5.5)
Alkaline Phosphatase: 47 IU/L (ref 39–117)
BUN / CREAT RATIO: 13 (ref 9–23)
BUN: 10 mg/dL (ref 6–20)
Bilirubin Total: 0.4 mg/dL (ref 0.0–1.2)
CALCIUM: 8.7 mg/dL (ref 8.7–10.2)
CHLORIDE: 106 mmol/L (ref 96–106)
CO2: 19 mmol/L — AB (ref 20–29)
CREATININE: 0.75 mg/dL (ref 0.57–1.00)
GFR calc Af Amer: 116 mL/min/{1.73_m2} (ref >59)
GFR, EST NON AFRICAN AMERICAN: 101 mL/min/{1.73_m2} (ref >59)
GLOBULIN, TOTAL: 2.5 g/dL (ref 1.5–4.5)
Glucose: 122 mg/dL — ABNORMAL HIGH (ref 65–99)
POTASSIUM: 3.8 mmol/L (ref 3.5–5.2)
Sodium: 140 mmol/L (ref 134–144)
Total Protein: 6.9 g/dL (ref 6.0–8.5)

## 2017-12-06 ENCOUNTER — Other Ambulatory Visit: Payer: Self-pay | Admitting: Family

## 2017-12-06 DIAGNOSIS — E669 Obesity, unspecified: Secondary | ICD-10-CM

## 2017-12-06 DIAGNOSIS — Z713 Dietary counseling and surveillance: Secondary | ICD-10-CM

## 2018-02-28 ENCOUNTER — Ambulatory Visit: Payer: PRIVATE HEALTH INSURANCE | Admitting: Family

## 2018-03-07 ENCOUNTER — Encounter: Payer: Self-pay | Admitting: Family

## 2018-03-07 ENCOUNTER — Ambulatory Visit (INDEPENDENT_AMBULATORY_CARE_PROVIDER_SITE_OTHER): Payer: PRIVATE HEALTH INSURANCE | Admitting: Family

## 2018-03-07 VITALS — BP 136/105 | HR 86 | Temp 97.2°F | Ht 65.0 in | Wt 180.2 lb

## 2018-03-07 DIAGNOSIS — Z713 Dietary counseling and surveillance: Secondary | ICD-10-CM | POA: Diagnosis not present

## 2018-03-07 DIAGNOSIS — E669 Obesity, unspecified: Secondary | ICD-10-CM

## 2018-03-07 MED ORDER — PHENTERMINE HCL 37.5 MG PO CAPS: 37.5000 mg | ORAL_CAPSULE | ORAL | 2 refills | Status: DC

## 2018-03-07 NOTE — Progress Notes (Signed)
   Subjective:    Patient ID: Tricia Castaneda, female    DOB: [birth year], 37 y.o.   MRN: [id number]  Chief Complaint  Patient presents with  . Medical Management of Chronic Issues    HPI Pt presents to the office today for refill of her phentermine. She has 8 pounds in the last three months. She states she is walking every day for about 2 miles. She states she has tried cutting back on her portions.    Review of Systems  All other systems reviewed and are negative.      Objective:   Physical Exam  Constitutional: She is oriented to person, place, and time. She appears well-developed and well-nourished. No distress.  HENT:  Head: Normocephalic and atraumatic.  Right Ear: External ear normal.  Left Ear: External ear normal.  Mouth/Throat: Oropharynx is clear and moist.  Eyes: Pupils are equal, round, and reactive to light.  Neck: Normal range of motion. Neck supple. No thyromegaly present.  Cardiovascular: Normal rate, regular rhythm, normal heart sounds and intact distal pulses.  No murmur heard. Pulmonary/Chest: Effort normal and breath sounds normal. No respiratory distress. She has no wheezes.  Abdominal: Soft. Bowel sounds are normal. She exhibits no distension. There is no tenderness.  Musculoskeletal: Normal range of motion. She exhibits no edema or tenderness.  Neurological: She is alert and oriented to person, place, and time. She has normal reflexes. No cranial nerve deficit.  Skin: Skin is warm and dry.  Psychiatric: She has a normal mood and affect. Her behavior is normal. Judgment and thought content normal.  Vitals reviewed.     BP (!) 136/105   Pulse 86   Temp (!) 97.2 F (36.2 C) (Oral)   Ht _0  (1.651 m)   Wt 180 lb 3.2 oz (81.7 kg)   BMI 29.99 kg/m      Assessment & Plan:  Tricia Castaneda comes in today with chief complaint of Medical Management of Chronic Issues   Diagnosis and orders addressed:  1. Weight loss counseling, encounter for -  phentermine 37.5 MG capsule; Take 1 capsule (37.5 mg total) by mouth every morning.  Dispense: 30 capsule; Refill: 2 - CMP14+EGFR  2. Obesity (BMI 30-39.9) - phentermine 37.5 MG capsule; Take 1 capsule (37.5 mg total) by mouth every morning.  Dispense: 30 capsule; Refill: 2 - CMP14+EGFR   Will continue Phentermine at this time Encouraged healthy diet and exercise Must lost 5 % of weight to continue medication  RTO in 3 months   Evelina Dun, FNP

## 2018-03-07 NOTE — Patient Instructions (Signed)
Exercising to Lose Weight Exercising can help you to lose weight. In order to lose weight through exercise, you need to do vigorous-intensity exercise. You can tell that you are exercising with vigorous intensity if you are breathing very hard and fast and cannot hold a conversation while exercising. Moderate-intensity exercise helps to maintain your current weight. You can tell that you are exercising at a moderate level if you have a higher heart rate and faster breathing, but you are still able to hold a conversation. How often should I exercise? Choose an activity that you enjoy and set realistic goals. Your health care provider can help you to make an activity plan that works for you. Exercise regularly as directed by your health care provider. This may include:  Doing resistance training twice each week, such as: ? Push-ups. ? Sit-ups. ? Lifting weights. ? Using resistance bands.  Doing a given intensity of exercise for a given amount of time. Choose from these options: ? 150 minutes of moderate-intensity exercise every week. ? 75 minutes of vigorous-intensity exercise every week. ? A mix of moderate-intensity and vigorous-intensity exercise every week.  Children, pregnant women, people who are out of shape, people who are overweight, and older adults may need to consult a health care provider for individual recommendations. If you have any sort of medical condition, be sure to consult your health care provider before starting a new exercise program. What are some activities that can help me to lose weight?  Walking at a rate of at least 4.5 miles an hour.  Jogging or running at a rate of 5 miles per hour.  Biking at a rate of at least 10 miles per hour.  Lap swimming.  Roller-skating or in-line skating.  Cross-country skiing.  Vigorous competitive sports, such as football, basketball, and soccer.  Jumping rope.  Aerobic dancing. How can I be more active in my day-to-day  activities?  Use the stairs instead of the elevator.  Take a walk during your lunch break.  If you drive, park your car farther away from work or school.  If you take public transportation, get off one stop early and walk the rest of the way.  Make all of your phone calls while standing up and walking around.  Get up, stretch, and walk around every 30 minutes throughout the day. What guidelines should I follow while exercising?  Do not exercise so much that you hurt yourself, feel dizzy, or get very short of breath.  Consult your health care provider prior to starting a new exercise program.  Wear comfortable clothes and shoes with good support.  Drink plenty of water while you exercise to prevent dehydration or heat stroke. Body water is lost during exercise and must be replaced.  Work out until you breathe faster and your heart beats faster. This information is not intended to replace advice given to you by your health care provider. Make sure you discuss any questions you have with your health care provider. Document Released: 06/06/2010 Document Revised: 10/10/2015 Document Reviewed: 10/05/2013 [employer] Interactive Patient Education  2018 [employer]  

## 2018-03-08 LAB — CMP14+EGFR
ALT: 21 IU/L (ref 0–32)
AST: 19 IU/L (ref 0–40)
Albumin/Globulin Ratio: 2 (ref 1.2–2.2)
Albumin: 4.7 g/dL (ref 3.5–5.5)
Alkaline Phosphatase: 78 IU/L (ref 39–117)
BUN/Creatinine Ratio: 13 (ref 9–23)
BUN: 9 mg/dL (ref 6–20)
Bilirubin Total: 0.4 mg/dL (ref 0.0–1.2)
CALCIUM: 9.1 mg/dL (ref 8.7–10.2)
CO2: 19 mmol/L — AB (ref 20–29)
CREATININE: 0.72 mg/dL (ref 0.57–1.00)
Chloride: 102 mmol/L (ref 96–106)
GFR calc Af Amer: 122 mL/min/{1.73_m2} (ref >59)
GFR, EST NON AFRICAN AMERICAN: 106 mL/min/{1.73_m2} (ref >59)
Globulin, Total: 2.4 g/dL (ref 1.5–4.5)
Glucose: 82 mg/dL (ref 65–99)
Potassium: 3.7 mmol/L (ref 3.5–5.2)
Sodium: 140 mmol/L (ref 134–144)
Total Protein: 7.1 g/dL (ref 6.0–8.5)

## 2018-03-28 IMAGING — CT CT ABD-PELV W/ CM
2 of 4 series · 16 of 46 positions shown, 18 images · IV contrast (Isovue)
Comparison: None.

CLINICAL DATA: Right upper quadrant abdominal pain

EXAM:
CT ABDOMEN AND PELVIS WITH CONTRAST
TECHNIQUE: Multidetector CT imaging of the abdomen and pelvis was performed
using the standard protocol following bolus administration of
intravenous contrast.
CONTRAST:  100mL VXHUE8-DPP IOPAMIDOL (VXHUE8-DPP) INJECTION 61%

[Series 2: axial st · axial · 0.73mm/px · z∈[+993,+1428]mm · 13 of 95 slices shown, 15 images]
[im 4/95  soft-tissue]
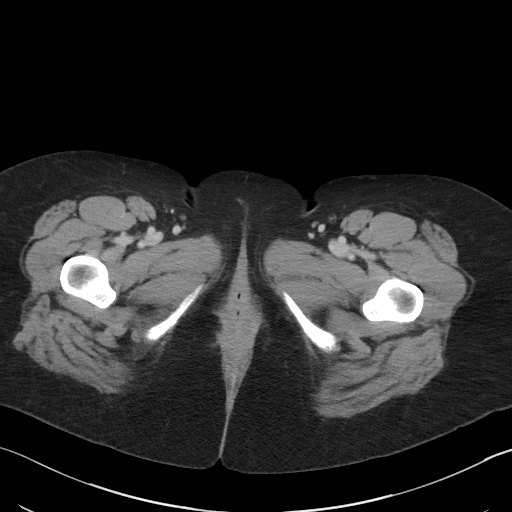
[im 4/95  bone]
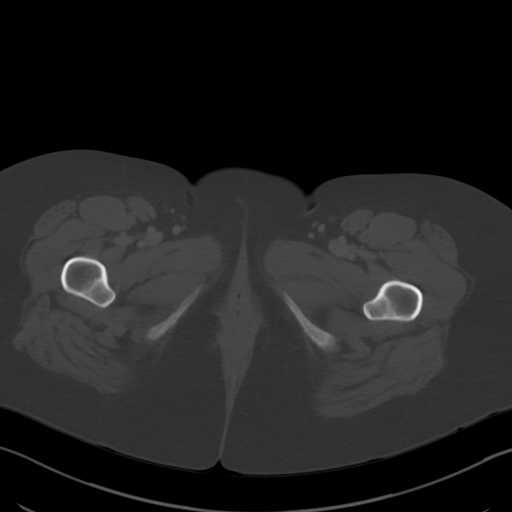
[im 12/95  soft-tissue]
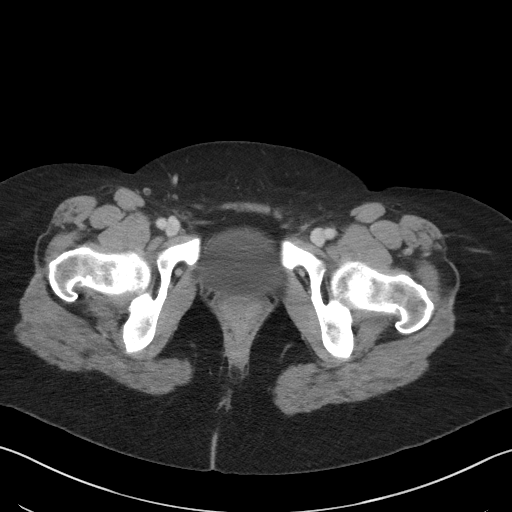
[im 20/95  soft-tissue]
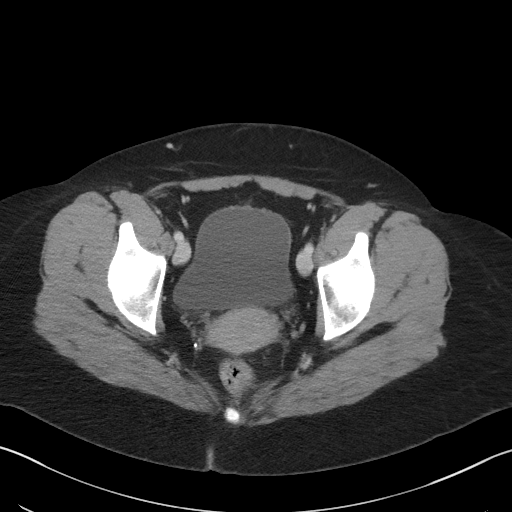
[im 28/95  soft-tissue]
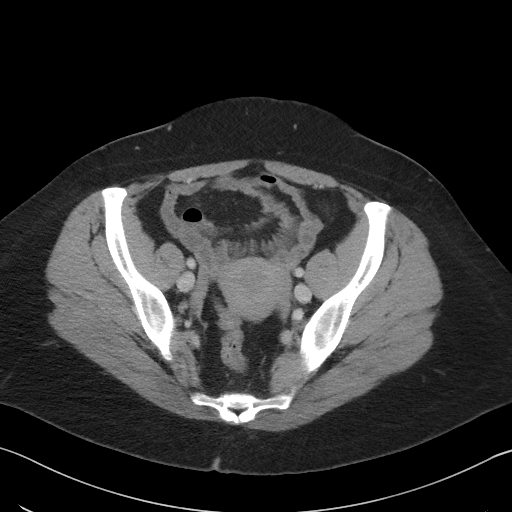
[im 32/95  soft-tissue]
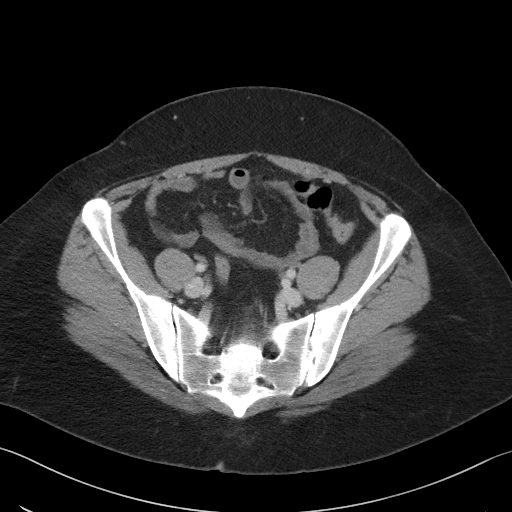
[im 40/95  soft-tissue]
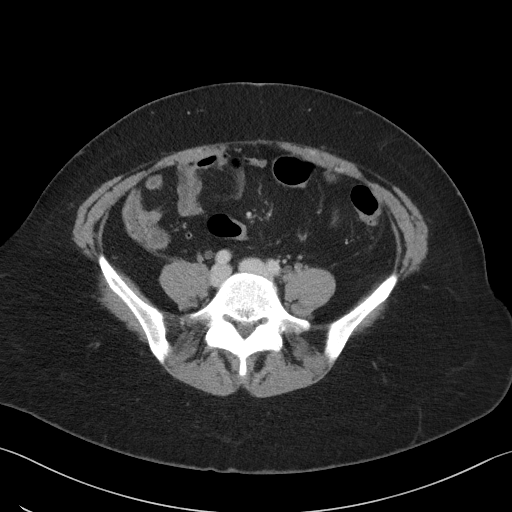
[im 48/95  soft-tissue]
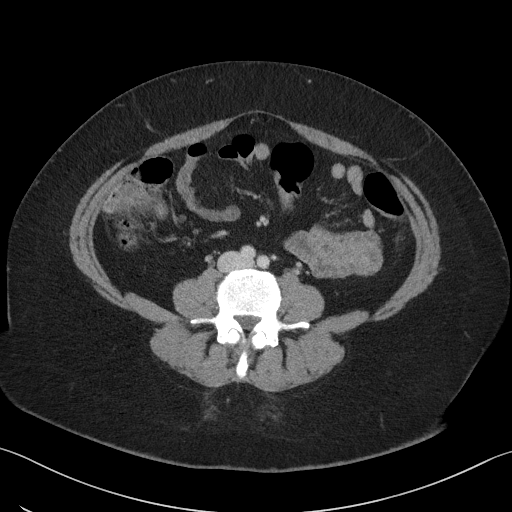
[im 55/95  soft-tissue]
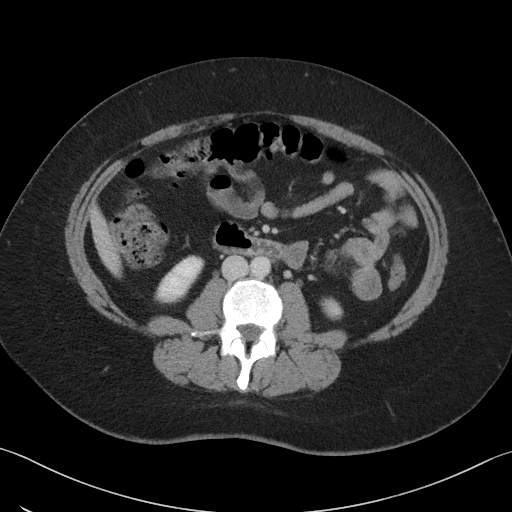
[im 63/95  soft-tissue]
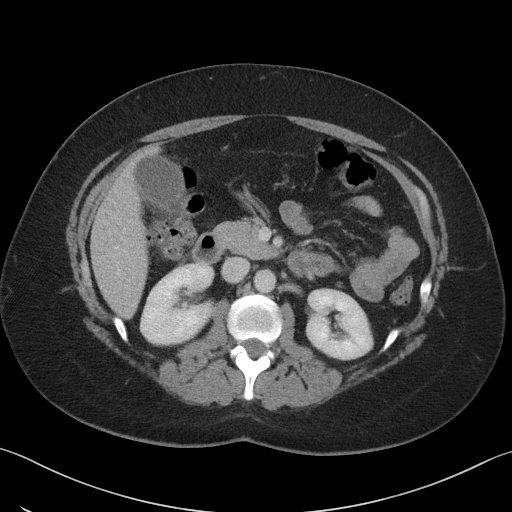
[im 63/95  bone]
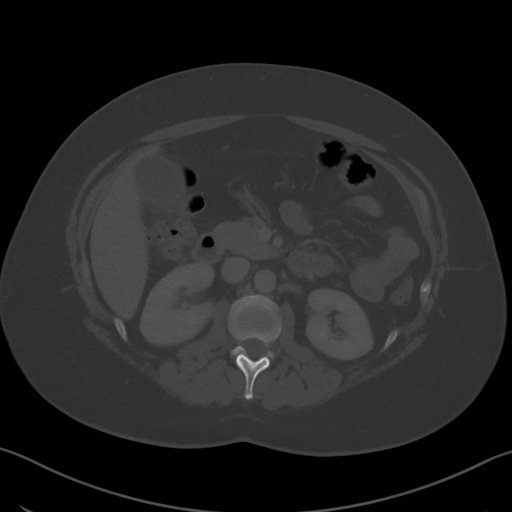
[im 67/95  soft-tissue]
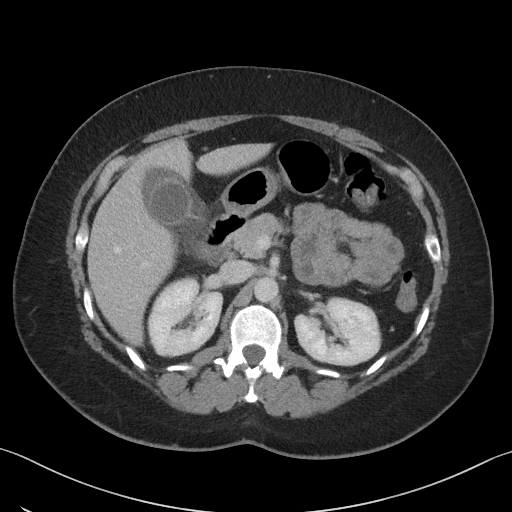
[im 75/95  soft-tissue]
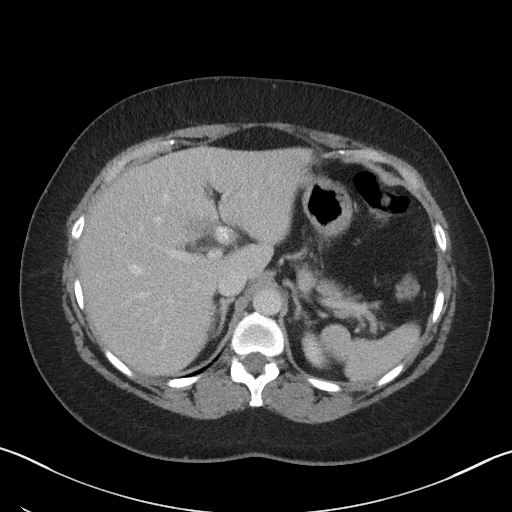
[im 83/95  soft-tissue]
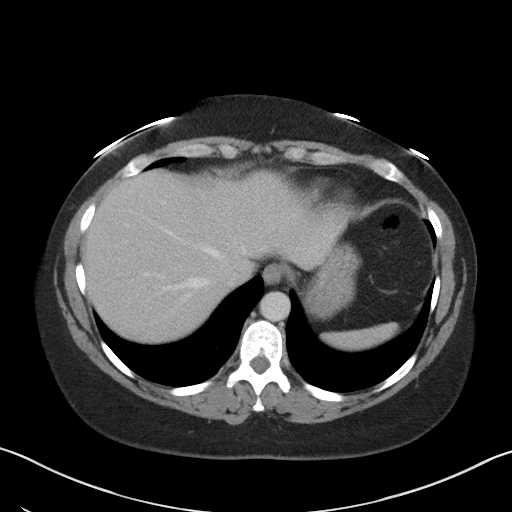
[im 91/95  soft-tissue]
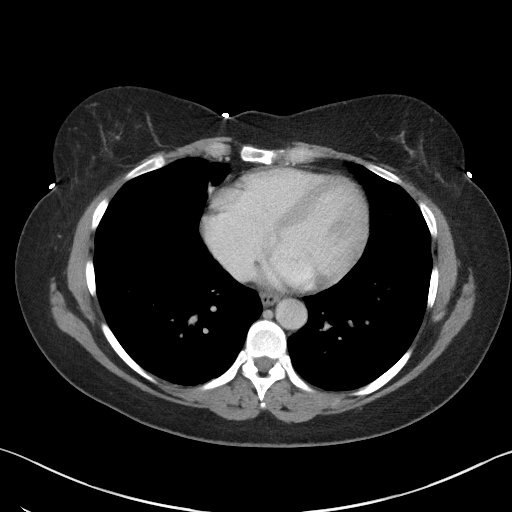

[Series 5: coronal st · coronal · 0.68mm/px · 3 of 101 slices shown]
[im 34/101  soft-tissue]
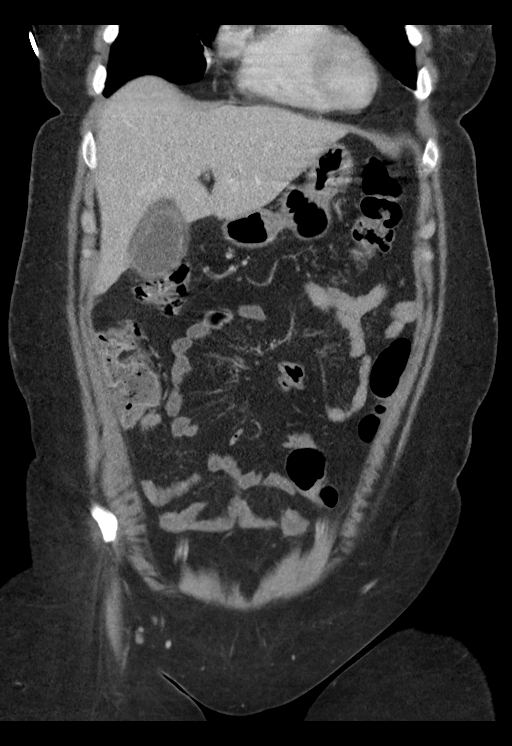
[im 45/101  soft-tissue]
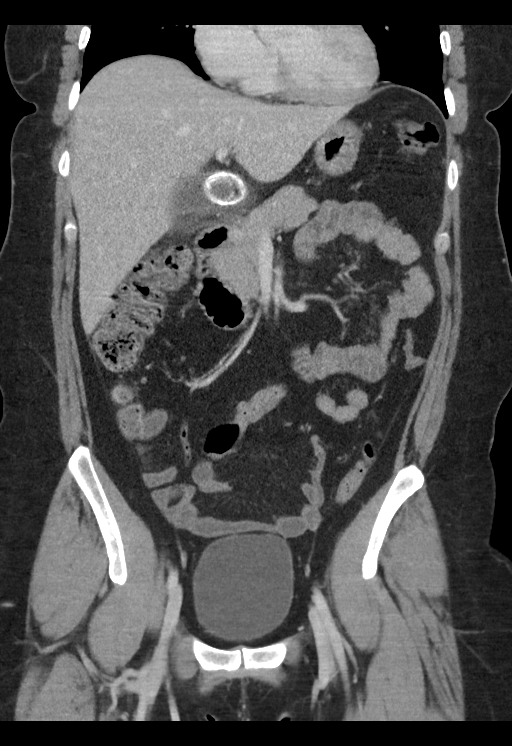
[im 56/101  soft-tissue]
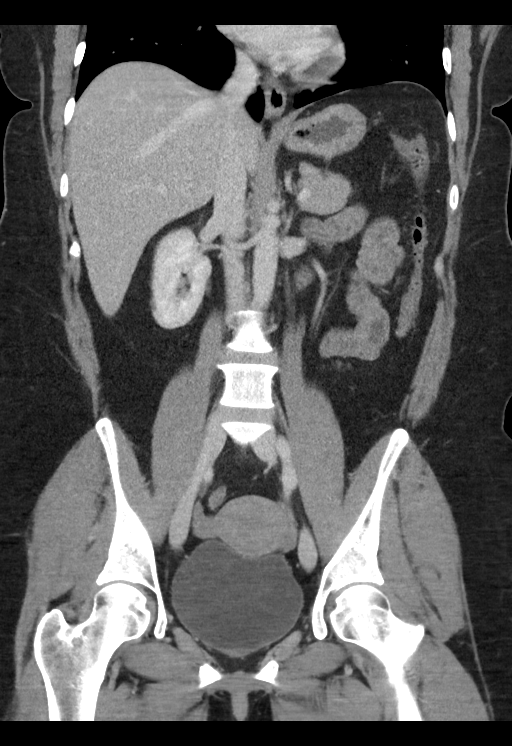

[16 of 46 positions shown; findings below may reference images not displayed]

FINDINGS: Lower chest: No acute abnormality.

Hepatobiliary: No focal hepatic abnormality. Large 3 cm calcified
stone in the gallbladder neck. Gallbladder wall thickening and
pericholecystic fluid. No biliary dilatation

Pancreas: Unremarkable. No pancreatic ductal dilatation or
surrounding inflammatory changes.

Spleen: Normal in size without focal abnormality.

Adrenals/Urinary Tract: Adrenal glands are within normal limits.
Subcentimeter hypodensities in the kidneys too small to further
characterize. The bladder is normal.

Stomach/Bowel: Stomach is within normal limits. Appendix appears
normal. No evidence of bowel wall thickening, distention, or
inflammatory changes.

Vascular/Lymphatic: No significant vascular findings are present. No
enlarged abdominal or pelvic lymph nodes.

Reproductive: Uterus and bilateral adnexa are unremarkable.

Other: Negative for free air or free fluid. Small fat in the
umbilical region.

Musculoskeletal: No acute or significant osseous findings.
IMPRESSION: 1. Large calcified gallstone in the gallbladder neck with
gallbladder wall thickening and pericholecystic fluid; collective
findings are suspicious for an acute cholecystitis. No biliary
dilatation is seen.
2. There are no other acute abnormalities visualized.

## 2018-06-07 ENCOUNTER — Ambulatory Visit: Payer: PRIVATE HEALTH INSURANCE | Admitting: Family

## 2018-07-12 ENCOUNTER — Ambulatory Visit: Payer: PRIVATE HEALTH INSURANCE | Admitting: Family

## 2018-07-15 ENCOUNTER — Encounter: Payer: Self-pay | Admitting: *Deleted

## 2018-08-16 ENCOUNTER — Encounter: Payer: Self-pay | Admitting: Family

## 2018-08-16 ENCOUNTER — Other Ambulatory Visit: Payer: Self-pay

## 2018-08-16 ENCOUNTER — Ambulatory Visit (INDEPENDENT_AMBULATORY_CARE_PROVIDER_SITE_OTHER): Payer: PRIVATE HEALTH INSURANCE | Admitting: Family

## 2018-08-16 DIAGNOSIS — I10 Essential (primary) hypertension: Secondary | ICD-10-CM

## 2018-08-16 NOTE — Progress Notes (Signed)
   Virtual Visit via telephone Note  I connected with Tricia Castaneda on 08/16/18 at 10:15AM  by telephone and verified that I am speaking with the correct person using two identifiers. Tricia Castaneda is currently located at home and no one is currently with her during visit. The provider, [name], FNP is located in their office at time of visit.  I discussed the limitations, risks, security and privacy concerns of performing an evaluation and management service by telephone and the availability of in person appointments. I also discussed with the patient that there may be a patient responsible charge related to this service. The patient expressed understanding and agreed to proceed.   History and Present Illness:  Pt presents for a virtual visit to recheck HTN. She was started on HCTZ 25 mg  At her GYN office when her BP was 132/94. She has since been checking her BP at home and it is 110's/90's. Hypertension  This is a chronic problem. The current episode started more than 1 month ago. The problem has been waxing and waning since onset. Associated symptoms include headaches (before starting medication). Pertinent negatives include no malaise/fatigue, palpitations, peripheral edema or shortness of breath. Past treatments include diuretics. The current treatment provides mild improvement. There is no history of kidney disease, CVA or heart failure.      Review of Systems  Constitutional: Negative for malaise/fatigue.  Respiratory: Negative for shortness of breath.   Cardiovascular: Negative for palpitations.  Neurological: Positive for headaches (before starting medication).       Observations/Objective: No SOB or distress noted  Assessment and Plan:     I discussed the assessment and treatment plan with the patient. The patient was provided an opportunity to ask questions and all were answered. The patient agreed with the plan and demonstrated an understanding of the  instructions.   The patient was advised to call back or seek an in-person evaluation if the symptoms worsen or if the condition fails to improve as anticipated.  The above assessment and management plan was discussed with the patient. The patient verbalized understanding of and has agreed to the management plan. Patient is aware to call the clinic if symptoms persist or worsen. Patient is aware when to return to the clinic for a follow-up visit. Patient educated on when it is appropriate to go to the emergency department.    Call ended 10:30AM,  provided 15 minutes of non-face-to-face time during this encounter.    [name], FNP

## 2018-08-25 ENCOUNTER — Encounter: Payer: Self-pay | Admitting: Family

## 2018-08-25 ENCOUNTER — Ambulatory Visit: Payer: 59 | Admitting: Family

## 2018-08-25 ENCOUNTER — Other Ambulatory Visit: Payer: Self-pay

## 2018-08-25 VITALS — BP 121/82 | HR 76 | Temp 98.9°F | Ht 65.0 in | Wt 190.4 lb

## 2018-08-25 DIAGNOSIS — E669 Obesity, unspecified: Secondary | ICD-10-CM | POA: Insufficient documentation

## 2018-08-25 DIAGNOSIS — Z0001 Encounter for general adult medical examination with abnormal findings: Secondary | ICD-10-CM | POA: Diagnosis not present

## 2018-08-25 DIAGNOSIS — I10 Essential (primary) hypertension: Secondary | ICD-10-CM

## 2018-08-25 DIAGNOSIS — Z0000 Encounter for general adult medical examination without abnormal findings: Secondary | ICD-10-CM

## 2018-08-25 DIAGNOSIS — E049 Nontoxic goiter, unspecified: Secondary | ICD-10-CM

## 2018-08-25 LAB — PREGNANCY, URINE: Preg Test, Ur: NEGATIVE

## 2018-08-25 MED ORDER — HYDROCHLOROTHIAZIDE 25 MG PO TABS: 25.0000 mg | ORAL_TABLET | Freq: Every day | ORAL | 3 refills | Status: DC

## 2018-08-25 NOTE — Progress Notes (Signed)
   Subjective:    Patient ID: Tricia Castaneda, female    DOB: 24-Jan-1981, 38 y.o.   MRN: [id number] Chief Complaint  Patient presents with  . Medical Management of Chronic Issues     lab work   Pt presents to the office today to recheck HTN.  Hypertension  This is a new problem. The current episode started more than 1 year ago. The problem has been waxing and waning since onset. The problem is uncontrolled. Pertinent negatives include no headaches, malaise/fatigue, peripheral edema or shortness of breath. Risk factors for coronary artery disease include obesity. Past treatments include diuretics. The current treatment provides moderate improvement. There is no history of kidney disease, CAD/MI or heart failure.      Review of Systems  Constitutional: Negative for malaise/fatigue.  Respiratory: Negative for shortness of breath.   Neurological: Negative for headaches.  All other systems reviewed and are negative.      Objective:   Physical Exam Vitals signs reviewed.  Constitutional:      General: She is not in acute distress.    Appearance: She is well-developed.  HENT:     Head: Normocephalic and atraumatic.     Right Ear: Tympanic membrane normal.     Left Ear: Tympanic membrane normal.  Eyes:     Pupils: Pupils are equal, round, and reactive to light.  Neck:     Musculoskeletal: Normal range of motion and neck supple.     Thyroid: No thyromegaly.  Cardiovascular:     Rate and Rhythm: Normal rate and regular rhythm.     Heart sounds: Normal heart sounds. No murmur.  Pulmonary:     Effort: Pulmonary effort is normal. No respiratory distress.     Breath sounds: Normal breath sounds. No wheezing.  Abdominal:     General: Bowel sounds are normal. There is no distension.     Palpations: Abdomen is soft.     Tenderness: There is no abdominal tenderness.  Musculoskeletal: Normal range of motion.        General: No tenderness.  Skin:    General: Skin is warm and dry.   Neurological:     Mental Status: She is alert and oriented to person, place, and time.     Cranial Nerves: No cranial nerve deficit.     Deep Tendon Reflexes: Reflexes are normal and symmetric.  Psychiatric:        Behavior: Behavior normal.        Thought Content: Thought content normal.        Judgment: Judgment normal.     BP 121/82   Pulse 76   Temp 98.9 F (37.2 C) (Oral)   Ht _0  (1.651 m)   Wt 190 lb 6.4 oz (86.4 kg)   LMP 07/21/2018   BMI 31.68 kg/m      Assessment & Plan:  Tricia Castaneda comes in today with chief complaint of Medical Management of Chronic Issues ( lab work)   Diagnosis and orders addressed:  1. Annual physical exam - CMP14+EGFR - CBC with Differential/Platelet - Lipid panel - TSH  2. Essential hypertension - CMP14+EGFR - CBC with Differential/Platelet  3. Obesity (BMI 30-39.9) - CMP14+EGFR - CBC with Differential/Platelet  4. Goiter - US THYROID; Future   Labs pending Health Maintenance reviewed Diet and exercise encouraged  Follow up plan: 1 year   Evelina Dun, FNP

## 2018-08-25 NOTE — Patient Instructions (Signed)

## 2018-08-26 ENCOUNTER — Other Ambulatory Visit: Payer: No Typology Code available for payment source

## 2018-08-26 LAB — CMP14+EGFR
ALT: 12 IU/L (ref 0–32)
AST: 16 IU/L (ref 0–40)
Albumin/Globulin Ratio: 1.8 (ref 1.2–2.2)
Albumin: 4.4 g/dL (ref 3.8–4.8)
Alkaline Phosphatase: 82 IU/L (ref 39–117)
BUN/Creatinine Ratio: 17 (ref 9–23)
BUN: 13 mg/dL (ref 6–20)
Bilirubin Total: 0.5 mg/dL (ref 0.0–1.2)
CO2: 20 mmol/L (ref 20–29)
Calcium: 8.1 mg/dL — ABNORMAL LOW (ref 8.7–10.2)
Chloride: 101 mmol/L (ref 96–106)
Creatinine, Ser: 0.77 mg/dL (ref 0.57–1.00)
GFR calc Af Amer: 112 mL/min/{1.73_m2} (ref >59)
GFR calc non Af Amer: 98 mL/min/{1.73_m2} (ref >59)
Globulin, Total: 2.5 g/dL (ref 1.5–4.5)
Glucose: 89 mg/dL (ref 65–99)
Potassium: 3.9 mmol/L (ref 3.5–5.2)
Sodium: 140 mmol/L (ref 134–144)
Total Protein: 6.9 g/dL (ref 6.0–8.5)

## 2018-08-26 LAB — CBC WITH DIFFERENTIAL/PLATELET
Basophils Absolute: 0 10*3/uL (ref 0.0–0.2)
Basos: 1 %
EOS (ABSOLUTE): 0.1 10*3/uL (ref 0.0–0.4)
Eos: 1 %
Hematocrit: 36.2 % (ref 34.0–46.6)
Hemoglobin: 12.5 g/dL (ref 11.1–15.9)
Immature Grans (Abs): 0 10*3/uL (ref 0.0–0.1)
Immature Granulocytes: 0 %
Lymphocytes Absolute: 2.7 10*3/uL (ref 0.7–3.1)
Lymphs: 36 %
MCH: 31.1 pg (ref 26.6–33.0)
MCHC: 34.5 g/dL (ref 31.5–35.7)
MCV: 90 fL (ref 79–97)
Monocytes Absolute: 0.5 10*3/uL (ref 0.1–0.9)
Monocytes: 6 %
Neutrophils Absolute: 4.2 10*3/uL (ref 1.4–7.0)
Neutrophils: 56 %
Platelets: 316 10*3/uL (ref 150–450)
RBC: 4.02 x10E6/uL (ref 3.77–5.28)
RDW: 11.9 % (ref 11.7–15.4)
WBC: 7.5 10*3/uL (ref 3.4–10.8)

## 2018-08-26 LAB — LIPID PANEL
Chol/HDL Ratio: 2 ratio (ref 0.0–4.4)
Cholesterol, Total: 184 mg/dL (ref 100–199)
HDL: 91 mg/dL (ref >39)
LDL Calculated: 82 mg/dL (ref 0–99)
Triglycerides: 53 mg/dL (ref 0–149)
VLDL Cholesterol Cal: 11 mg/dL (ref 5–40)

## 2018-08-26 LAB — TSH: TSH: 1.32 u[IU]/mL (ref 0.450–4.500)

## 2018-08-29 ENCOUNTER — Ambulatory Visit
Admission: RE | Admit: 2018-08-29 | Discharge: 2018-08-29 | Disposition: A | Discharge status: Home / Self Care | Payer: PRIVATE HEALTH INSURANCE | Source: Ambulatory Visit | Attending: Family | Admitting: Family

## 2018-08-29 ENCOUNTER — Other Ambulatory Visit: Payer: Self-pay

## 2018-08-29 DIAGNOSIS — E049 Nontoxic goiter, unspecified: Secondary | ICD-10-CM

## 2018-09-01 ENCOUNTER — Other Ambulatory Visit: Payer: 59

## 2018-09-01 ENCOUNTER — Other Ambulatory Visit: Payer: Self-pay | Admitting: *Deleted

## 2018-09-01 ENCOUNTER — Other Ambulatory Visit: Payer: Self-pay

## 2018-09-01 DIAGNOSIS — E049 Nontoxic goiter, unspecified: Secondary | ICD-10-CM

## 2018-09-02 LAB — PARATHYROID HORMONE, INTACT (NO CA): PTH: 45 pg/mL (ref 15–65)

## 2018-09-13 ENCOUNTER — Telehealth: Payer: Self-pay | Admitting: Family

## 2018-09-15 NOTE — Telephone Encounter (Signed)
Spoke with billing. This was a mistake. Nurse thought we were doing physical and starting OC. Discussing with Patient she just had OB/GYN appt and did not need OC.

## 2019-03-21 ENCOUNTER — Telehealth: Payer: PRIVATE HEALTH INSURANCE | Admitting: Physician Assistant

## 2019-03-21 DIAGNOSIS — J329 Chronic sinusitis, unspecified: Secondary | ICD-10-CM

## 2019-03-21 MED ORDER — AZITHROMYCIN 250 MG PO TABS: ORAL_TABLET | ORAL | 0 refills | Status: DC

## 2019-10-28 ENCOUNTER — Other Ambulatory Visit: Payer: Self-pay | Admitting: Family

## 2019-10-30 NOTE — Telephone Encounter (Signed)
Appointment scheduled, patient states she has enough meds to last until appointment

## 2019-10-30 NOTE — Telephone Encounter (Signed)
[facility] NTBS LOV 08/25/18

## 2019-11-07 ENCOUNTER — Encounter: Payer: Self-pay | Admitting: Family

## 2019-11-07 ENCOUNTER — Ambulatory Visit (INDEPENDENT_AMBULATORY_CARE_PROVIDER_SITE_OTHER): Payer: Managed Care, Other (non HMO) | Admitting: Family

## 2019-11-07 ENCOUNTER — Other Ambulatory Visit: Payer: Self-pay

## 2019-11-07 VITALS — BP 117/85 | HR 73 | Temp 97.7°F | Ht 65.0 in | Wt 195.6 lb

## 2019-11-07 DIAGNOSIS — Z0001 Encounter for general adult medical examination with abnormal findings: Secondary | ICD-10-CM

## 2019-11-07 DIAGNOSIS — F320 Major depressive disorder, single episode, mild: Secondary | ICD-10-CM | POA: Diagnosis not present

## 2019-11-07 DIAGNOSIS — E669 Obesity, unspecified: Secondary | ICD-10-CM

## 2019-11-07 DIAGNOSIS — I10 Essential (primary) hypertension: Secondary | ICD-10-CM | POA: Diagnosis not present

## 2019-11-07 DIAGNOSIS — Z0000 Encounter for general adult medical examination without abnormal findings: Secondary | ICD-10-CM

## 2019-11-07 DIAGNOSIS — Z1159 Encounter for screening for other viral diseases: Secondary | ICD-10-CM

## 2019-11-07 DIAGNOSIS — F411 Generalized anxiety disorder: Secondary | ICD-10-CM | POA: Insufficient documentation

## 2019-11-07 MED ORDER — HYDROCHLOROTHIAZIDE 25 MG PO TABS: 25.0000 mg | ORAL_TABLET | Freq: Every day | ORAL | 3 refills | Status: DC

## 2019-11-07 NOTE — Patient Instructions (Signed)
Health Maintenance, Female Adopting a healthy lifestyle and getting preventive care are important in promoting health and wellness. Ask your health care provider about:  The right schedule for you to have regular tests and exams.  Things you can do on your own to prevent diseases and keep yourself healthy. What should I know about diet, weight, and exercise? Eat a healthy diet   Eat a diet that includes plenty of vegetables, fruits, low-fat dairy products, and lean protein.  Do not eat a lot of foods that are high in solid fats, added sugars, or sodium. Maintain a healthy weight Body mass index (BMI) is used to identify weight problems. It estimates body fat based on height and weight. Your health care provider can help determine your BMI and help you achieve or maintain a healthy weight. Get regular exercise Get regular exercise. This is one of the most important things you can do for your health. Most adults should:  Exercise for at least 150 minutes each week. The exercise should increase your heart rate and make you sweat (moderate-intensity exercise).  Do strengthening exercises at least twice a week. This is in addition to the moderate-intensity exercise.  Spend less time sitting. Even light physical activity can be beneficial. Watch cholesterol and blood lipids Have your blood tested for lipids and cholesterol at 41 years of age, then have this test every 5 years. Have your cholesterol levels checked more often if:  Your lipid or cholesterol levels are high.  You are older than 40 years of age.  You are at high risk for heart disease. What should I know about cancer screening? Depending on your health history and family history, you may need to have cancer screening at various ages. This may include screening for:  Breast cancer.  Cervical cancer.  Colorectal cancer.  Skin cancer.  Lung cancer. What should I know about heart disease, diabetes, and high blood  pressure? Blood pressure and heart disease  High blood pressure causes heart disease and increases the risk of stroke. This is more likely to develop in people who have high blood pressure readings, are of African descent, or are overweight.  Have your blood pressure checked: ? Every 3-5 years if you are 18-39 years of age. ? Every year if you are 40 years old or older. Diabetes Have regular diabetes screenings. This checks your fasting blood sugar level. Have the screening done:  Once every three years after age 40 if you are at a normal weight and have a low risk for diabetes.  More often and at a younger age if you are overweight or have a high risk for diabetes. What should I know about preventing infection? Hepatitis B If you have a higher risk for hepatitis B, you should be screened for this virus. Talk with your health care provider to find out if you are at risk for hepatitis B infection. Hepatitis C Testing is recommended for:  Everyone born from 1945 through 1965.  Anyone with known risk factors for hepatitis C. Sexually transmitted infections (STIs)  Get screened for STIs, including gonorrhea and chlamydia, if: ? You are sexually active and are younger than 41 years of age. ? You are older than 41 years of age and your health care provider tells you that you are at risk for this type of infection. ? Your sexual activity has changed since you were last screened, and you are at increased risk for chlamydia or gonorrhea. Ask your health care provider if   you are at risk.  Ask your health care provider about whether you are at high risk for HIV. Your health care provider may recommend a prescription medicine to help prevent HIV infection. If you choose to take medicine to prevent HIV, you should first get tested for HIV. You should then be tested every 3 months for as long as you are taking the medicine. Pregnancy  If you are about to stop having your period (premenopausal) and  you may become pregnant, seek counseling before you get pregnant.  Take 400 to 800 micrograms (mcg) of folic acid every day if you become pregnant.  Ask for birth control (contraception) if you want to prevent pregnancy. Osteoporosis and menopause Osteoporosis is a disease in which the bones lose minerals and strength with aging. This can result in bone fractures. If you are 65 years old or older, or if you are at risk for osteoporosis and fractures, ask your health care provider if you should:  Be screened for bone loss.  Take a calcium or vitamin D supplement to lower your risk of fractures.  Be given hormone replacement therapy (HRT) to treat symptoms of menopause. Follow these instructions at home: Lifestyle  Do not use any products that contain nicotine or tobacco, such as cigarettes, e-cigarettes, and chewing tobacco. If you need help quitting, ask your health care provider.  Do not use street drugs.  Do not share needles.  Ask your health care provider for help if you need support or information about quitting drugs. Alcohol use  Do not drink alcohol if: ? Your health care provider tells you not to drink. ? You are pregnant, may be pregnant, or are planning to become pregnant.  If you drink alcohol: ? Limit how much you use to 0-1 drink a day. ? Limit intake if you are breastfeeding.  Be aware of how much alcohol is in your drink. In the [place] one drink equals one 12 oz bottle of beer (355 mL), one 5 oz glass of wine (148 mL), or one 1 oz glass of hard liquor (44 mL). General instructions  Schedule regular health, dental, and eye exams.  Stay current with your vaccines.  Tell your health care provider if: ? You often feel depressed. ? You have ever been abused or do not feel safe at home. Summary  Adopting a healthy lifestyle and getting preventive care are important in promoting health and wellness.  Follow your health care provider's instructions about healthy  diet, exercising, and getting tested or screened for diseases.  Follow your health care provider's instructions on monitoring your cholesterol and blood pressure. This information is not intended to replace advice given to you by your health care provider. Make sure you discuss any questions you have with your health care provider. Document Revised: 04/27/2018 Document Reviewed: 04/27/2018 Elsevier Patient Education  2020 [employer]  

## 2019-11-07 NOTE — Progress Notes (Signed)
Subjective:    Patient ID: Tricia Castaneda, female    DOB: 03/02/1981, 39 y.o.   MRN: [id number]  Chief Complaint  Patient presents with   Hypertension   Pt presents to the office today for CPE without pap. She is followed by GYN annually.  Hypertension This is a chronic problem. The current episode started more than 1 year ago. The problem has been resolved since onset. The problem is controlled. Associated symptoms include anxiety. Pertinent negatives include no malaise/fatigue, peripheral edema or shortness of breath. Risk factors for coronary artery disease include sedentary lifestyle. The current treatment provides moderate improvement. There is no history of CVA or heart failure.  Depression        This is a chronic problem.  The current episode started more than 1 year ago.   The onset quality is gradual.   The problem occurs intermittently.  Associated symptoms include restlessness and decreased interest.  Associated symptoms include no helplessness, no hopelessness and not sad.  Past treatments include SSRIs - Selective serotonin reuptake inhibitors.  Compliance with treatment is good.  Past medical history includes anxiety.   Anxiety Presents for follow-up visit. Symptoms include depressed mood, irritability, nervous/anxious behavior and restlessness. Patient reports no shortness of breath. Symptoms occur most days. The severity of symptoms is moderate. The quality of sleep is good.        Review of Systems  Constitutional: Positive for irritability. Negative for malaise/fatigue.  Respiratory: Negative for shortness of breath.   Psychiatric/Behavioral: Positive for depression. The patient is nervous/anxious.   All other systems reviewed and are negative.  History reviewed. No pertinent family history. Social History   Socioeconomic History   Marital status: Married    Spouse name: Not on file   Number of children: Not on file   Years of education: Not on file    Highest education level: Not on file  Occupational History   Not on file  Tobacco Use   Smoking status: Former Smoker    Quit date: 01/18/2016    Years since quitting: 3.8   Smokeless tobacco: Never Used  Vaping Use   Vaping Use: Never used  Substance and Sexual Activity   Alcohol use: Yes    Comment: social   Drug use: No   Sexual activity: Yes    Birth control/protection: Pill  Other Topics Concern   Not on file  Social History Narrative   Not on file   Social Determinants of Health   Financial Resource Strain:    Difficulty of Paying Living Expenses:   Food Insecurity:    Worried About Charity fundraiser in the Last Year:    Arboriculturist in the Last Year:   Transportation Needs:    Film/video editor (Medical):    Lack of Transportation (Non-Medical):   Physical Activity:    Days of Exercise per Week:    Minutes of Exercise per Session:   Stress:    Feeling of Stress :   Social Connections:    Frequency of Communication with Friends and Family:    Frequency of Social Gatherings with Friends and Family:    Attends Religious Services:    Active Member of Clubs or Organizations:    Attends Archivist Meetings:    Marital Status:   Intimate Partner Violence:    Fear of Current or Ex-Partner:    Emotionally Abused:    Physically Abused:    Sexually Abused:  Objective:   Physical Exam Vitals reviewed.  Constitutional:      General: She is not in acute distress.    Appearance: She is well-developed.  HENT:     Head: Normocephalic and atraumatic.     Right Ear: Tympanic membrane normal.     Left Ear: Tympanic membrane normal.  Eyes:     Pupils: Pupils are equal, round, and reactive to light.  Neck:     Thyroid: No thyromegaly.  Cardiovascular:     Rate and Rhythm: Normal rate and regular rhythm.     Heart sounds: Normal heart sounds. No murmur heard.   Pulmonary:     Effort: Pulmonary effort is normal. No  respiratory distress.     Breath sounds: Normal breath sounds. No wheezing.  Abdominal:     General: Bowel sounds are normal. There is no distension.     Palpations: Abdomen is soft.     Tenderness: There is no abdominal tenderness.  Musculoskeletal:        General: No tenderness. Normal range of motion.     Cervical back: Normal range of motion and neck supple.  Skin:    General: Skin is warm and dry.  Neurological:     Mental Status: She is alert and oriented to person, place, and time.     Cranial Nerves: No cranial nerve deficit.     Deep Tendon Reflexes: Reflexes are normal and symmetric.  Psychiatric:        Behavior: Behavior normal.        Thought Content: Thought content normal.        Judgment: Judgment normal.       BP 117/85    Pulse 73    Temp 97.7 F (36.5 C) (Temporal)    Ht '5\' 5"'$  (1.651 m)    Wt 195 lb 9.6 oz (88.7 kg)    SpO2 97%    BMI 32.55 kg/m      Assessment & Plan:  Altair Barajas comes in today with chief complaint of Hypertension   Diagnosis and orders addressed:  1. Annual physical exam - CMP14+EGFR - CBC with Differential/Platelet - Lipid panel - TSH - Hepatitis C antibody  2. Essential hypertension - hydrochlorothiazide (HYDRODIURIL) 25 MG tablet; Take 1 tablet (25 mg total) by mouth daily.  Dispense: 90 tablet; Refill: 3 - CMP14+EGFR - CBC with Differential/Platelet  3. Obesity (BMI 30-39.9) - CMP14+EGFR - CBC with Differential/Platelet  4. Depression, major, single episode, mild (HCC) - CMP14+EGFR - CBC with Differential/Platelet  5. GAD (generalized anxiety disorder) - CMP14+EGFR - CBC with Differential/Platelet  6. Need for hepatitis C screening test - CMP14+EGFR - CBC with Differential/Platelet - Hepatitis C antibody   Labs pending Health Maintenance reviewed Diet and exercise encouraged  Follow up plan: 1 year    Evelina Dun, FNP

## 2019-11-08 LAB — CMP14+EGFR
ALT: 14 IU/L (ref 0–32)
AST: 21 IU/L (ref 0–40)
Albumin/Globulin Ratio: 1.7 (ref 1.2–2.2)
Albumin: 4.3 g/dL (ref 3.8–4.8)
Alkaline Phosphatase: 88 IU/L (ref 48–121)
BUN/Creatinine Ratio: 19 (ref 9–23)
BUN: 12 mg/dL (ref 6–24)
Bilirubin Total: 0.4 mg/dL (ref 0.0–1.2)
CO2: 23 mmol/L (ref 20–29)
Calcium: 8.7 mg/dL (ref 8.7–10.2)
Chloride: 100 mmol/L (ref 96–106)
Creatinine, Ser: 0.64 mg/dL (ref 0.57–1.00)
GFR calc Af Amer: 128 mL/min/{1.73_m2} (ref >59)
GFR calc non Af Amer: 111 mL/min/{1.73_m2} (ref >59)
Globulin, Total: 2.5 g/dL (ref 1.5–4.5)
Glucose: 82 mg/dL (ref 65–99)
Potassium: 3.8 mmol/L (ref 3.5–5.2)
Sodium: 139 mmol/L (ref 134–144)
Total Protein: 6.8 g/dL (ref 6.0–8.5)

## 2019-11-08 LAB — TSH: TSH: 1.53 u[IU]/mL (ref 0.450–4.500)

## 2019-11-08 LAB — LIPID PANEL
Chol/HDL Ratio: 2.7 ratio (ref 0.0–4.4)
Cholesterol, Total: 214 mg/dL — ABNORMAL HIGH (ref 100–199)
HDL: 79 mg/dL (ref >39)
LDL Chol Calc (NIH): 122 mg/dL — ABNORMAL HIGH (ref 0–99)
Triglycerides: 75 mg/dL (ref 0–149)
VLDL Cholesterol Cal: 13 mg/dL (ref 5–40)

## 2019-11-08 LAB — CBC WITH DIFFERENTIAL/PLATELET
Basophils Absolute: 0 10*3/uL (ref 0.0–0.2)
Basos: 1 %
EOS (ABSOLUTE): 0.1 10*3/uL (ref 0.0–0.4)
Eos: 1 %
Hematocrit: 39.9 % (ref 34.0–46.6)
Hemoglobin: 13.5 g/dL (ref 11.1–15.9)
Immature Grans (Abs): 0 10*3/uL (ref 0.0–0.1)
Immature Granulocytes: 0 %
Lymphocytes Absolute: 2.6 10*3/uL (ref 0.7–3.1)
Lymphs: 33 %
MCH: 31.8 pg (ref 26.6–33.0)
MCHC: 33.8 g/dL (ref 31.5–35.7)
MCV: 94 fL (ref 79–97)
Monocytes Absolute: 0.6 10*3/uL (ref 0.1–0.9)
Monocytes: 7 %
Neutrophils Absolute: 4.7 10*3/uL (ref 1.4–7.0)
Neutrophils: 58 %
Platelets: 297 10*3/uL (ref 150–450)
RBC: 4.25 x10E6/uL (ref 3.77–5.28)
RDW: 13 % (ref 11.7–15.4)
WBC: 8 10*3/uL (ref 3.4–10.8)

## 2019-11-08 LAB — HEPATITIS C ANTIBODY: Hep C Virus Ab: <0.1 s/co ratio (ref 0.0–0.9)

## 2020-05-23 ENCOUNTER — Telehealth: Payer: PRIVATE HEALTH INSURANCE | Admitting: Emergency Medicine

## 2020-05-23 DIAGNOSIS — U071 COVID-19: Secondary | ICD-10-CM

## 2020-05-23 MED ORDER — BENZONATATE 100 MG PO CAPS
100.0000 mg | ORAL_CAPSULE | Freq: Two times a day (BID) | ORAL | 0 refills | Status: DC | PRN
Start: 2020-05-23 — End: 2020-12-06

## 2020-05-23 NOTE — Progress Notes (Signed)
E-Visit for Corona Virus Screening  We are sorry you are not feeling well. We are here to help!  You have tested positive for COVID-19, meaning that you were infected with the novel coronavirus and could give the virus to others.  It is vitally important that you stay home so you do not spread it to others.      Please continue isolation at home, for at least 10 days since the start of your symptoms and until you have had 24 hours with no fever (without taking a fever reducer) and with improving of symptoms.  If you have no symptoms but tested positive (or all symptoms resolve after 5 days and you have no fever) you can leave your house but continue to wear a mask around others for an additional 5 days. If you have a fever,continue to stay home until you have had 24 hours of no fever. Most cases improve 5-10 days from onset but we have seen a small number of patients who have gotten worse after the 10 days.  Please be sure to watch for worsening symptoms and remain taking the proper precautions.   Go to the nearest hospital ED for assessment if fever/cough/breathlessness are severe or illness seems like a threat to life.    The following symptoms may appear 2-14 days after exposure: . Fever . Cough . Shortness of breath or difficulty breathing . Chills . Repeated shaking with chills . Muscle pain . Headache . Sore throat . New loss of taste or smell . Fatigue . Congestion or runny nose . Nausea or vomiting . Diarrhea  You have been enrolled in [facility] Monitoring for COVID-19. Daily you will receive a questionnaire within the MyChart website. Our COVID-19 response team will be monitoring your responses daily.  You can use medication such as A prescription cough medication called Tessalon Perles 100 mg. You may take 1-2 capsules every 8 hours as needed for cough     You may also take acetaminophen (Tylenol) as needed for fever.  HOME CARE: . Only take medications as instructed  by your medical team. . Drink plenty of fluids and get plenty of rest. . A steam or ultrasonic humidifier can help if you have congestion.   GET HELP RIGHT AWAY IF YOU HAVE EMERGENCY WARNING SIGNS.  Call 911 or proceed to your closest emergency facility if: . You develop worsening high fever. . Trouble breathing . Bluish lips or face . Persistent pain or pressure in the chest . New confusion . Inability to wake or stay awake . You cough up blood. . Your symptoms become more severe . Inability to hold down food or fluids  This list is not all possible symptoms. Contact your medical provider for any symptoms that are severe or concerning to you.    Your e-visit answers were reviewed by a board certified advanced clinical practitioner to complete your personal care plan.  Depending on the condition, your plan could have included both over the counter or prescription medications.  If there is a problem please reply once you have received a response from your provider.  Your safety is important to [place].  If you have drug allergies check your prescription carefully.    You can use MyChart to ask questions about today's visit, request a non-urgent call back, or ask for a work or school excuse for 24 hours related to this e-Visit. If it has been greater than 24 hours you will need to follow up with  your provider, or enter a new e-Visit to address those concerns. You will get an e-mail in the next two days asking about your experience.  I hope that your e-visit has been valuable and will speed your recovery. Thank you for using e-visits.      Approximately 5 minutes was used in reviewing the patient's chart, questionnaire, prescribing medications, and documentation.

## 2020-06-20 ENCOUNTER — Ambulatory Visit (INDEPENDENT_AMBULATORY_CARE_PROVIDER_SITE_OTHER): Payer: Managed Care, Other (non HMO) | Admitting: Family

## 2020-06-20 ENCOUNTER — Encounter: Payer: Self-pay | Admitting: Family

## 2020-06-20 ENCOUNTER — Telehealth: Payer: Self-pay

## 2020-06-20 DIAGNOSIS — U071 COVID-19: Secondary | ICD-10-CM | POA: Diagnosis not present

## 2020-06-20 NOTE — Progress Notes (Signed)
   Virtual Visit via telephone Note Due to COVID-19 pandemic this visit was conducted virtually. This visit type was conducted due to national recommendations for restrictions regarding the COVID-19 Pandemic (e.g. social distancing, sheltering in place) in an effort to limit this patient's exposure and mitigate transmission in our community. All issues noted in this document were discussed and addressed.  A physical exam was not performed with this format.  I connected with Evgenia Bermingham on 06/20/20 at 4:03 pm  by telephone and verified that I am speaking with the correct person using two identifiers. Ardelia Holtmeyer is currently located at home and no one  is currently with her during visit. The provider, [name], FNP is located in their office at time of visit.  I discussed the limitations, risks, security and privacy concerns of performing an evaluation and management service by telephone and the availability of in person appointments. I also discussed with the patient that there may be a patient responsible charge related to this service. The patient expressed understanding and agreed to proceed.   History and Present Illness:  HPI PT calls the office today requesting a note for work. She reports she had COVID symptoms on 05/21/20. We did not have an appointment and was told to do an Evisit. She took an at home COVID test and was positive. She reports she had fever, cough, nausea, vomiting, diarrhea, and body aches for 5 days. Her symptoms have resolved now.    Review of Systems  All other systems reviewed and are negative.    Observations/Objective: No SOB or distress noted   Assessment and Plan: 1. COVID-19 virus detected Note given to patient  Symptoms resolved Call if symptoms return    I discussed the assessment and treatment plan with the patient. The patient was provided an opportunity to ask questions and all were answered. The patient agreed with the plan and demonstrated  an understanding of the instructions.   The patient was advised to call back or seek an in-person evaluation if the symptoms worsen or if the condition fails to improve as anticipated.  The above assessment and management plan was discussed with the patient. The patient verbalized understanding of and has agreed to the management plan. Patient is aware to call the clinic if symptoms persist or worsen. Patient is aware when to return to the clinic for a follow-up visit. Patient educated on when it is appropriate to go to the emergency department.   Time call ended:  4:12 pm   I provided 9 minutes of non-face-to-face time during this encounter.    [name], FNP

## 2020-06-20 NOTE — Telephone Encounter (Signed)
Will do telephone visit

## 2020-06-20 NOTE — Telephone Encounter (Signed)
Pt called stating that she was advised from someone from our office to request an E Visit with a [facility] provider regarding symptoms she was having. Pt had evisit with provider on 05/23/20 and tested positive for COVID.  Pt was told to quarantine, which she did, but now says her job is denying her to be paid for her time out of work because they can't accept her e-visit notes as proof that she had COVID because her test was done at home.  Pt needs note from provider stating that she tested positive for covid and should be excused from work.   Please advise and call patient.

## 2020-07-19 ENCOUNTER — Telehealth: Payer: Managed Care, Other (non HMO) | Admitting: Emergency Medicine

## 2020-07-19 DIAGNOSIS — J029 Acute pharyngitis, unspecified: Secondary | ICD-10-CM

## 2020-07-19 NOTE — Progress Notes (Signed)
We are sorry that you are not feeling well.  Here is how we plan to help!  Your symptoms indicate a likely viral infection (Pharyngitis).   Pharyngitis is inflammation in the back of the throat which can cause a sore throat, scratchiness and sometimes difficulty swallowing.   Pharyngitis is typically caused by a respiratory virus and will just run its course.  Please keep in mind that your symptoms could last up to 10 days.  For throat pain, we recommend over the counter oral pain relief medications such as acetaminophen or aspirin, or anti-inflammatory medications such as ibuprofen or naproxen sodium.  Topical treatments such as oral throat lozenges or sprays may be used as needed.  Avoid close contact with loved ones, especially the very young and elderly.  Remember to wash your hands thoroughly throughout the day as this is the number one way to prevent the spread of infection and wipe down door knobs and counters with disinfectant.  If the swelling is significant, or if it turns red, or if you develop difficulty breathing, you should be seen at and urgent care or an ER.  If the symptoms are still present at 7-10 days, you should be seen in person and have a physical exam to evaluate your throat and neck.  After careful review of your answers, I would not recommend and antibiotic for your condition.  Antibiotics should not be used to treat conditions that we suspect are caused by viruses like the virus that causes the common cold or flu. However, some people can have Strep with atypical symptoms. You may need formal testing in clinic or office to confirm if your symptoms continue or worsen.  Providers prescribe antibiotics to treat infections caused by bacteria. Antibiotics are very powerful in treating bacterial infections when they are used properly.  To maintain their effectiveness, they should be used only when necessary.  Overuse of antibiotics has resulted in the development of super bugs that are  resistant to treatment!    Home Care:  Only take medications as instructed by your medical team.  Do not drink alcohol while taking these medications.  A steam or ultrasonic humidifier can help congestion.  You can place a towel over your head and breathe in the steam from hot water coming from a faucet.  Avoid close contacts especially the very young and the elderly.  Cover your mouth when you cough or sneeze.  Always remember to wash your hands.  Get Help Right Away If:  You develop worsening fever or throat pain.  You develop a severe head ache or visual changes.  Your symptoms persist after you have completed your treatment plan.  Make sure you  Understand these instructions.  Will watch your condition.  Will get help right away if you are not doing well or get worse.  Your e-visit answers were reviewed by a board certified advanced clinical practitioner to complete your personal care plan.  Depending on the condition, your plan could have included both over the counter or prescription medications.  If there is a problem please reply  once you have received a response from your provider.  Your safety is important to [place].  If you have drug allergies check your prescription carefully.    You can use MyChart to ask questions about todays visit, request a non-urgent call back, or ask for a work or school excuse for 24 hours related to this e-Visit. If it has been greater than 24 hours you will need  to follow up with your provider, or enter a new e-Visit to address those concerns.  You will get an e-mail in the next two days asking about your experience.  I hope that your e-visit has been valuable and will speed your recovery. Thank you for using e-visits.   Approximately 5 minutes was used in reviewing the patient's chart, questionnaire, prescribing medications, and documentation.

## 2020-11-21 ENCOUNTER — Other Ambulatory Visit: Payer: Self-pay | Admitting: Family

## 2020-11-21 DIAGNOSIS — I10 Essential (primary) hypertension: Secondary | ICD-10-CM

## 2020-12-06 ENCOUNTER — Ambulatory Visit: Payer: Managed Care, Other (non HMO) | Admitting: Family

## 2020-12-06 ENCOUNTER — Other Ambulatory Visit: Payer: Self-pay

## 2020-12-06 ENCOUNTER — Encounter: Payer: Self-pay | Admitting: Family

## 2020-12-06 VITALS — BP 114/80 | HR 75 | Temp 97.8°F | Ht 65.0 in | Wt 201.0 lb

## 2020-12-06 DIAGNOSIS — E669 Obesity, unspecified: Secondary | ICD-10-CM

## 2020-12-06 DIAGNOSIS — F320 Major depressive disorder, single episode, mild: Secondary | ICD-10-CM

## 2020-12-06 DIAGNOSIS — Z23 Encounter for immunization: Secondary | ICD-10-CM | POA: Diagnosis not present

## 2020-12-06 DIAGNOSIS — I10 Essential (primary) hypertension: Secondary | ICD-10-CM | POA: Diagnosis not present

## 2020-12-06 DIAGNOSIS — F411 Generalized anxiety disorder: Secondary | ICD-10-CM

## 2020-12-06 DIAGNOSIS — Z0001 Encounter for general adult medical examination with abnormal findings: Secondary | ICD-10-CM | POA: Diagnosis not present

## 2020-12-06 DIAGNOSIS — Z0000 Encounter for general adult medical examination without abnormal findings: Secondary | ICD-10-CM

## 2020-12-06 MED ORDER — HYDROCHLOROTHIAZIDE 25 MG PO TABS: 25.0000 mg | ORAL_TABLET | Freq: Every day | ORAL | 4 refills | Status: DC

## 2020-12-06 MED ORDER — SERTRALINE HCL 100 MG PO TABS: ORAL_TABLET | ORAL | 4 refills | Status: DC

## 2020-12-06 NOTE — Patient Instructions (Signed)
Health Maintenance, Female Adopting a healthy lifestyle and getting preventive care are important in promoting health and wellness. Ask your health care provider about: The right schedule for you to have regular tests and exams. Things you can do on your own to prevent diseases and keep yourself healthy. What should I know about diet, weight, and exercise? Eat a healthy diet  Eat a diet that includes plenty of vegetables, fruits, low-fat dairy products, and lean protein. Do not eat a lot of foods that are high in solid fats, added sugars, or sodium.  Maintain a healthy weight Body mass index (BMI) is used to identify weight problems. It estimates body fat based on height and weight. Your health care provider can help determineyour BMI and help you achieve or maintain a healthy weight. Get regular exercise Get regular exercise. This is one of the most important things you can do for your health. Most adults should: Exercise for at least 150 minutes each week. The exercise should increase your heart rate and make you sweat (moderate-intensity exercise). Do strengthening exercises at least twice a week. This is in addition to the moderate-intensity exercise. Spend less time sitting. Even light physical activity can be beneficial. Watch cholesterol and blood lipids Have your blood tested for lipids and cholesterol at 42 years of age, then havethis test every 5 years. Have your cholesterol levels checked more often if: Your lipid or cholesterol levels are high. You are older than 42 years of age. You are at high risk for heart disease. What should I know about cancer screening? Depending on your health history and family history, you may need to have cancer screening at various ages. This may include screening for: Breast cancer. Cervical cancer. Colorectal cancer. Skin cancer. Lung cancer. What should I know about heart disease, diabetes, and high blood pressure? Blood pressure and heart  disease High blood pressure causes heart disease and increases the risk of stroke. This is more likely to develop in people who have high blood pressure readings, are of African descent, or are overweight. Have your blood pressure checked: Every 3-5 years if you are 18-39 years of age. Every year if you are 40 years old or older. Diabetes Have regular diabetes screenings. This checks your fasting blood sugar level. Have the screening done: Once every three years after age 40 if you are at a normal weight and have a low risk for diabetes. More often and at a younger age if you are overweight or have a high risk for diabetes. What should I know about preventing infection? Hepatitis B If you have a higher risk for hepatitis B, you should be screened for this virus. Talk with your health care provider to find out if you are at risk forhepatitis B infection. Hepatitis C Testing is recommended for: Everyone born from 1945 through 1965. Anyone with known risk factors for hepatitis C. Sexually transmitted infections (STIs) Get screened for STIs, including gonorrhea and chlamydia, if: You are sexually active and are younger than 42 years of age. You are older than 42 years of age and your health care provider tells you that you are at risk for this type of infection. Your sexual activity has changed since you were last screened, and you are at increased risk for chlamydia or gonorrhea. Ask your health care provider if you are at risk. Ask your health care provider about whether you are at high risk for HIV. Your health care provider may recommend a prescription medicine to help   prevent HIV infection. If you choose to take medicine to prevent HIV, you should first get tested for HIV. You should then be tested every 3 months for as long as you are taking the medicine. Pregnancy If you are about to stop having your period (premenopausal) and you may become pregnant, seek counseling before you get  pregnant. Take 400 to 800 micrograms (mcg) of folic acid every day if you become pregnant. Ask for birth control (contraception) if you want to prevent pregnancy. Osteoporosis and menopause Osteoporosis is a disease in which the bones lose minerals and strength with aging. This can result in bone fractures. If you are 65 years old or older, or if you are at risk for osteoporosis and fractures, ask your health care provider if you should: Be screened for bone loss. Take a calcium or vitamin D supplement to lower your risk of fractures. Be given hormone replacement therapy (HRT) to treat symptoms of menopause. Follow these instructions at home: Lifestyle Do not use any products that contain nicotine or tobacco, such as cigarettes, e-cigarettes, and chewing tobacco. If you need help quitting, ask your health care provider. Do not use street drugs. Do not share needles. Ask your health care provider for help if you need support or information about quitting drugs. Alcohol use Do not drink alcohol if: Your health care provider tells you not to drink. You are pregnant, may be pregnant, or are planning to become pregnant. If you drink alcohol: Limit how much you use to 0-1 drink a day. Limit intake if you are breastfeeding. Be aware of how much alcohol is in your drink. In the [place] one drink equals one 12 oz bottle of beer (355 mL), one 5 oz glass of wine (148 mL), or one 1 oz glass of hard liquor (44 mL). General instructions Schedule regular health, dental, and eye exams. Stay current with your vaccines. Tell your health care provider if: You often feel depressed. You have ever been abused or do not feel safe at home. Summary Adopting a healthy lifestyle and getting preventive care are important in promoting health and wellness. Follow your health care provider's instructions about healthy diet, exercising, and getting tested or screened for diseases. Follow your health care provider's  instructions on monitoring your cholesterol and blood pressure. This information is not intended to replace advice given to you by your health care provider. Make sure you discuss any questions you have with your healthcare provider. Document Revised: 04/27/2018 Document Reviewed: 04/27/2018 Elsevier Patient Education  2022 [employer] Inc[employer]  

## 2020-12-06 NOTE — Progress Notes (Signed)
Subjective:    Patient ID: Tricia Castaneda, female    DOB: 08/05/1980, 40 y.o.   MRN: [id number]  Chief Complaint  Patient presents with   Annual Exam   Pt presents to the office today for CPE without pap. She is followed by GYN annually.  Hypertension This is a chronic problem. The current episode started more than 1 year ago. The problem has been resolved since onset. The problem is controlled. Associated symptoms include anxiety, malaise/fatigue (some times) and palpitations. Pertinent negatives include no peripheral edema or shortness of breath. Risk factors for coronary artery disease include dyslipidemia and obesity. The current treatment provides moderate improvement.  Anxiety Presents for follow-up visit. Symptoms include excessive worry, irritability, nervous/anxious behavior, palpitations and restlessness. Patient reports no shortness of breath. Symptoms occur most days. The severity of symptoms is moderate.    Depression        This is a chronic problem.  The current episode started more than 1 year ago.   The onset quality is gradual.   The problem occurs intermittently.  Associated symptoms include irritable, restlessness and sad.  Associated symptoms include no helplessness and no hopelessness.  Past treatments include SSRIs - Selective serotonin reuptake inhibitors.  Compliance with treatment is good.  Past medical history includes anxiety.      Review of Systems  Constitutional:  Positive for irritability and malaise/fatigue (some times).  Respiratory:  Negative for shortness of breath.   Cardiovascular:  Positive for palpitations.  Psychiatric/Behavioral:  Positive for depression. The patient is nervous/anxious.   All other systems reviewed and are negative.     Objective:   Physical Exam Vitals reviewed.  Constitutional:      General: She is irritable. She is not in acute distress.    Appearance: She is well-developed.  HENT:     Head: Normocephalic and atraumatic.      Right Ear: Tympanic membrane normal.     Left Ear: Tympanic membrane normal.  Eyes:     Pupils: Pupils are equal, round, and reactive to light.  Neck:     Thyroid: No thyromegaly.  Cardiovascular:     Rate and Rhythm: Normal rate and regular rhythm.     Heart sounds: Normal heart sounds. No murmur heard. Pulmonary:     Effort: Pulmonary effort is normal. No respiratory distress.     Breath sounds: Normal breath sounds. No wheezing.  Abdominal:     General: Bowel sounds are normal. There is no distension.     Palpations: Abdomen is soft.     Tenderness: There is no abdominal tenderness.  Musculoskeletal:        General: No tenderness. Normal range of motion.     Cervical back: Normal range of motion and neck supple.  Skin:    General: Skin is warm and dry.  Neurological:     Mental Status: She is alert and oriented to person, place, and time.     Cranial Nerves: No cranial nerve deficit.     Deep Tendon Reflexes: Reflexes are normal and symmetric.  Psychiatric:        Behavior: Behavior normal.        Thought Content: Thought content normal.        Judgment: Judgment normal.      BP 114/80   Pulse 75   Temp 97.8 F (36.6 C) (Temporal)   Ht $R'5\' 5"'ef$  (1.651 m)   Wt 201 lb (91.2 kg)   SpO2 95%   BMI 33.45  kg/m      Assessment & Plan:  Tricia Castaneda comes in today with chief complaint of Annual Exam   Diagnosis and orders addressed:  1. Primary hypertension - CMP14+EGFR - CBC with Differential/Platelet - hydrochlorothiazide (HYDRODIURIL) 25 MG tablet; Take 1 tablet (25 mg total) by mouth daily.  Dispense: 90 tablet; Refill: 4  2. Obesity (BMI 30-39.9) - CMP14+EGFR - CBC with Differential/Platelet  3. Depression, major, single episode, mild (HCC) - CMP14+EGFR - CBC with Differential/Platelet - sertraline (ZOLOFT) 100 MG tablet; sertraline 100 mg tablet  TAKE ONE TABLET BY MOUTH EVERY DAY  Dispense: 90 tablet; Refill: 4  4. GAD (generalized anxiety  disorder) - CMP14+EGFR - CBC with Differential/Platelet - sertraline (ZOLOFT) 100 MG tablet; sertraline 100 mg tablet  TAKE ONE TABLET BY MOUTH EVERY DAY  Dispense: 90 tablet; Refill: 4  5. Annual physical exam - CMP14+EGFR - CBC with Differential/Platelet - Lipid panel - TSH    Labs pending Health Maintenance reviewed Diet and exercise encouraged  Follow up plan: 1 year    Tricia Dun, FNP

## 2020-12-07 LAB — CBC WITH DIFFERENTIAL/PLATELET
Basophils Absolute: 0 10*3/uL (ref 0.0–0.2)
Basos: 1 %
EOS (ABSOLUTE): 0.1 10*3/uL (ref 0.0–0.4)
Eos: 1 %
Hematocrit: 40.4 % (ref 34.0–46.6)
Hemoglobin: 13.4 g/dL (ref 11.1–15.9)
Immature Grans (Abs): 0 10*3/uL (ref 0.0–0.1)
Immature Granulocytes: 0 %
Lymphocytes Absolute: 2.3 10*3/uL (ref 0.7–3.1)
Lymphs: 31 %
MCH: 31.6 pg (ref 26.6–33.0)
MCHC: 33.2 g/dL (ref 31.5–35.7)
MCV: 95 fL (ref 79–97)
Monocytes Absolute: 0.5 10*3/uL (ref 0.1–0.9)
Monocytes: 7 %
Neutrophils Absolute: 4.4 10*3/uL (ref 1.4–7.0)
Neutrophils: 60 %
Platelets: 308 10*3/uL (ref 150–450)
RBC: 4.24 x10E6/uL (ref 3.77–5.28)
RDW: 12.8 % (ref 11.7–15.4)
WBC: 7.5 10*3/uL (ref 3.4–10.8)

## 2020-12-07 LAB — CMP14+EGFR
ALT: 20 IU/L (ref 0–32)
AST: 56 IU/L — ABNORMAL HIGH (ref 0–40)
Albumin/Globulin Ratio: 1.8 (ref 1.2–2.2)
Albumin: 4.4 g/dL (ref 3.8–4.8)
Alkaline Phosphatase: 84 IU/L (ref 44–121)
BUN/Creatinine Ratio: 14 (ref 9–23)
BUN: 11 mg/dL (ref 6–24)
Bilirubin Total: 0.4 mg/dL (ref 0.0–1.2)
CO2: 23 mmol/L (ref 20–29)
Calcium: 9.2 mg/dL (ref 8.7–10.2)
Chloride: 100 mmol/L (ref 96–106)
Creatinine, Ser: 0.78 mg/dL (ref 0.57–1.00)
Globulin, Total: 2.4 g/dL (ref 1.5–4.5)
Glucose: 91 mg/dL (ref 65–99)
Potassium: 3.8 mmol/L (ref 3.5–5.2)
Sodium: 139 mmol/L (ref 134–144)
Total Protein: 6.8 g/dL (ref 6.0–8.5)
eGFR: 97 mL/min/{1.73_m2} (ref >59)

## 2020-12-07 LAB — LIPID PANEL
Chol/HDL Ratio: 3 ratio (ref 0.0–4.4)
Cholesterol, Total: 228 mg/dL — ABNORMAL HIGH (ref 100–199)
HDL: 77 mg/dL (ref >39)
LDL Chol Calc (NIH): 129 mg/dL — ABNORMAL HIGH (ref 0–99)
Triglycerides: 124 mg/dL (ref 0–149)
VLDL Cholesterol Cal: 22 mg/dL (ref 5–40)

## 2020-12-07 LAB — TSH: TSH: 1.31 u[IU]/mL (ref 0.450–4.500)

## 2020-12-10 ENCOUNTER — Other Ambulatory Visit: Payer: Self-pay | Admitting: Family

## 2020-12-10 DIAGNOSIS — R748 Abnormal levels of other serum enzymes: Secondary | ICD-10-CM

## 2021-01-14 ENCOUNTER — Other Ambulatory Visit: Payer: Self-pay

## 2021-01-14 ENCOUNTER — Ambulatory Visit: Payer: Managed Care, Other (non HMO) | Admitting: Family

## 2021-01-14 ENCOUNTER — Encounter: Payer: Self-pay | Admitting: Family

## 2021-01-14 VITALS — BP 115/82 | HR 73 | Temp 97.8°F | Ht 65.0 in | Wt 200.8 lb

## 2021-01-14 DIAGNOSIS — E669 Obesity, unspecified: Secondary | ICD-10-CM

## 2021-01-14 DIAGNOSIS — Z713 Dietary counseling and surveillance: Secondary | ICD-10-CM

## 2021-01-14 DIAGNOSIS — R748 Abnormal levels of other serum enzymes: Secondary | ICD-10-CM

## 2021-01-14 DIAGNOSIS — L709 Acne, unspecified: Secondary | ICD-10-CM | POA: Diagnosis not present

## 2021-01-14 MED ORDER — TIRZEPATIDE 5 MG/0.5ML ~~LOC~~ SOAJ: 5.0000 mg | SUBCUTANEOUS | 0 refills | Status: DC

## 2021-01-14 MED ORDER — CLINDAMYCIN PHOS-BENZOYL PEROX 1-5 % EX GEL: Freq: Two times a day (BID) | CUTANEOUS | 2 refills | Status: DC

## 2021-01-14 MED ORDER — TIRZEPATIDE 2.5 MG/0.5ML ~~LOC~~ SOAJ: 2.5000 mg | SUBCUTANEOUS | 0 refills | Status: DC

## 2021-01-14 NOTE — Patient Instructions (Signed)

## 2021-01-14 NOTE — Progress Notes (Signed)
Subjective:    Patient ID: Tricia Castaneda, female    DOB: 1981/05/02, 40 y.o.   MRN: [id number]  Chief Complaint  Patient presents with   Weight Check    Wants to start mounjar   Acne    From ocp   Elevated Hepatic Enzymes    HPI Pt presents to the office today to discuss weight loss medication. She reports she is exercising for 60 mins 4 times a week. She is paying a Systems analyst and reports her diet is "ok". She does not eat a lot of "junk" because she does not have a gallbladder. She reports she has only lost 5 lbs since March.   Since starting the progesterone birth control she has noticed increase in acne on her cheeks and chin.   She also wants to retest her liver enzymes. Her AST was 56.    Review of Systems  All other systems reviewed and are negative.     Objective:   Physical Exam Vitals reviewed.  Constitutional:      General: She is not in acute distress.    Appearance: She is well-developed.  HENT:     Head: Normocephalic and atraumatic.     Right Ear: Tympanic membrane normal.     Left Ear: Tympanic membrane normal.  Eyes:     Pupils: Pupils are equal, round, and reactive to light.  Neck:     Thyroid: No thyromegaly.  Cardiovascular:     Rate and Rhythm: Normal rate and regular rhythm.     Heart sounds: Normal heart sounds. No murmur heard. Pulmonary:     Effort: Pulmonary effort is normal. No respiratory distress.     Breath sounds: Normal breath sounds. No wheezing.  Abdominal:     General: Bowel sounds are normal. There is no distension.     Palpations: Abdomen is soft.     Tenderness: There is no abdominal tenderness.  Musculoskeletal:        General: No tenderness. Normal range of motion.     Cervical back: Normal range of motion and neck supple.  Skin:    General: Skin is warm and dry.  Neurological:     Mental Status: She is alert and oriented to person, place, and time.     Cranial Nerves: No cranial nerve deficit.     Deep Tendon  Reflexes: Reflexes are normal and symmetric.  Psychiatric:        Behavior: Behavior normal.        Thought Content: Thought content normal.        Judgment: Judgment normal.     BP 115/82   Pulse 73   Temp 97.8 F (36.6 C) (Temporal)   Ht 5\' 5"  (1.651 m)   Wt 200 lb 12.8 oz (91.1 kg)   BMI 33.41 kg/m      Assessment & Plan:  Tricia Castaneda comes in today with chief complaint of Weight Check (Wants to start mounjar), Acne (From ocp), and Elevated Hepatic Enzymes   Diagnosis and orders addressed:  1. Weight loss counseling, encounter for Start Mounjaro 2.5 mg weekly for one month then increase to 5 mg  Continue exercise  - tirzepatide (MOUNJARO) 2.5 MG/0.5ML Pen; Inject 2.5 mg into the skin once a week.  Dispense: 2 mL; Refill: 0 - tirzepatide (MOUNJARO) 5 MG/0.5ML Pen; Inject 5 mg into the skin once a week.  Dispense: 6 mL; Refill: 0 - BMP8+EGFR  2. Obesity (BMI 30-39.9) - BMP8+EGFR  3. Elevated liver  enzymes - BMP8+EGFR - Hepatic function panel  4. Acne, unspecified acne type Benzaclin as needed - clindamycin-benzoyl peroxide (BENZACLIN) gel; Apply topically 2 (two) times daily.  Dispense: 50 g; Refill: 2 - BMP8+EGFR   Labs pending Health Maintenance reviewed Diet and exercise encouraged  Follow up plan: 2 month to follow up on weight loss   Evelina Dun, FNP

## 2021-01-15 LAB — HEPATIC FUNCTION PANEL
ALT: 14 IU/L (ref 0–32)
AST: 20 IU/L (ref 0–40)
Albumin: 4.5 g/dL (ref 3.8–4.8)
Alkaline Phosphatase: 78 IU/L (ref 44–121)
Bilirubin Total: 0.6 mg/dL (ref 0.0–1.2)
Bilirubin, Direct: 0.18 mg/dL (ref 0.00–0.40)
Total Protein: 6.7 g/dL (ref 6.0–8.5)

## 2021-01-15 LAB — BMP8+EGFR
BUN/Creatinine Ratio: 18 (ref 9–23)
BUN: 12 mg/dL (ref 6–24)
CO2: 23 mmol/L (ref 20–29)
Calcium: 8.6 mg/dL — ABNORMAL LOW (ref 8.7–10.2)
Chloride: 97 mmol/L (ref 96–106)
Creatinine, Ser: 0.66 mg/dL (ref 0.57–1.00)
Glucose: 96 mg/dL (ref 65–99)
Potassium: 3.5 mmol/L (ref 3.5–5.2)
Sodium: 136 mmol/L (ref 134–144)
eGFR: 112 mL/min/{1.73_m2} (ref >59)

## 2021-02-10 ENCOUNTER — Other Ambulatory Visit: Payer: Self-pay | Admitting: Family

## 2021-02-10 DIAGNOSIS — Z713 Dietary counseling and surveillance: Secondary | ICD-10-CM

## 2021-02-10 MED ORDER — TIRZEPATIDE 5 MG/0.5ML ~~LOC~~ SOAJ: 5.0000 mg | SUBCUTANEOUS | 2 refills | Status: DC

## 2021-03-18 ENCOUNTER — Ambulatory Visit: Payer: Managed Care, Other (non HMO) | Admitting: Family

## 2021-07-09 ENCOUNTER — Telehealth: Payer: Self-pay | Admitting: Family Medicine

## 2021-07-09 NOTE — Telephone Encounter (Signed)
Your information has been submitted and will be reviewed by [employer]. You may close this dialog, return to your dashboard, and perform other tasks. An electronic determination will be received in CoverMyMeds within 72-120 hours. You can see the latest determination by locating this request on your dashboard or by reopening this request. You will receive a fax copy of the determination. If [employer] has not responded in 120 hours, contact [employer] at [phone]. 

## 2021-07-14 NOTE — Telephone Encounter (Signed)
PA denied per fax due to no indication of type 2 diabetes. Per fax Rosann Auerbach does not cover this medication for any other indication because it is considered experimental, investigational, or unproven. Provider notified and will follow up with patient

## 2021-11-21 ENCOUNTER — Encounter: Payer: Self-pay | Admitting: Family

## 2021-11-21 ENCOUNTER — Ambulatory Visit: Payer: Managed Care, Other (non HMO) | Admitting: Family

## 2021-11-21 VITALS — BP 122/83 | HR 72 | Temp 97.8°F | Ht 65.0 in | Wt 199.0 lb

## 2021-11-21 DIAGNOSIS — I10 Essential (primary) hypertension: Secondary | ICD-10-CM

## 2021-11-21 DIAGNOSIS — Z713 Dietary counseling and surveillance: Secondary | ICD-10-CM

## 2021-11-21 DIAGNOSIS — E669 Obesity, unspecified: Secondary | ICD-10-CM

## 2021-11-21 DIAGNOSIS — F320 Major depressive disorder, single episode, mild: Secondary | ICD-10-CM

## 2021-11-21 DIAGNOSIS — Z0001 Encounter for general adult medical examination with abnormal findings: Secondary | ICD-10-CM | POA: Diagnosis not present

## 2021-11-21 DIAGNOSIS — Z0000 Encounter for general adult medical examination without abnormal findings: Secondary | ICD-10-CM

## 2021-11-21 DIAGNOSIS — F411 Generalized anxiety disorder: Secondary | ICD-10-CM

## 2021-11-21 MED ORDER — HYDROCHLOROTHIAZIDE 25 MG PO TABS: 25.0000 mg | ORAL_TABLET | Freq: Every day | ORAL | 4 refills | Status: DC

## 2021-11-21 MED ORDER — PHENTERMINE HCL 37.5 MG PO TABS: 37.5000 mg | ORAL_TABLET | Freq: Every day | ORAL | 2 refills | Status: DC

## 2021-11-21 NOTE — Patient Instructions (Addendum)

## 2021-11-21 NOTE — Progress Notes (Signed)
Subjective:    Patient ID: Tricia Castaneda, female    DOB: Jun 20, 1980, 41 y.o.   MRN: [id number]  Chief Complaint  Patient presents with   Medication Refill   Pt presents to the office today for CPE without pap. She is followed by GYN annually.  Requesting medications for weight loss medications. Unsure what her insurance covers. States since increasing her Zoloft dose she has gained weight.  Hypertension This is a chronic problem. The current episode started more than 1 year ago. The problem has been resolved since onset. The problem is controlled. Associated symptoms include anxiety. Pertinent negatives include no malaise/fatigue, peripheral edema or shortness of breath. Risk factors for coronary artery disease include dyslipidemia, obesity and sedentary lifestyle. The current treatment provides moderate improvement.  Anxiety Presents for follow-up visit. Symptoms include depressed mood, excessive worry, irritability, nervous/anxious behavior and restlessness. Patient reports no shortness of breath. Symptoms occur most days. The severity of symptoms is moderate.    Depression        This is a chronic problem.  The current episode started more than 1 year ago.   The onset quality is gradual.   The problem occurs intermittently.  Associated symptoms include irritable, restlessness and sad.  Associated symptoms include no helplessness and no hopelessness.  Past treatments include SSRIs - Selective serotonin reuptake inhibitors.  Past medical history includes anxiety.       Review of Systems  Constitutional:  Positive for irritability. Negative for malaise/fatigue.  Respiratory:  Negative for shortness of breath.   Psychiatric/Behavioral:  Positive for depression. The patient is nervous/anxious.   All other systems reviewed and are negative.      Objective:   Physical Exam Vitals reviewed.  Constitutional:      General: She is irritable. She is not in acute distress.    Appearance:  She is well-developed. She is obese.  HENT:     Head: Normocephalic and atraumatic.     Right Ear: Tympanic membrane normal.     Left Ear: Tympanic membrane normal.  Eyes:     Pupils: Pupils are equal, round, and reactive to light.  Neck:     Thyroid: No thyromegaly.  Cardiovascular:     Rate and Rhythm: Normal rate and regular rhythm.     Heart sounds: Normal heart sounds. No murmur heard. Pulmonary:     Effort: Pulmonary effort is normal. No respiratory distress.     Breath sounds: Normal breath sounds. No wheezing.  Abdominal:     General: Bowel sounds are normal. There is no distension.     Palpations: Abdomen is soft.     Tenderness: There is no abdominal tenderness.  Musculoskeletal:        General: No tenderness. Normal range of motion.     Cervical back: Normal range of motion and neck supple.  Skin:    General: Skin is warm and dry.  Neurological:     Mental Status: She is alert and oriented to person, place, and time.     Cranial Nerves: No cranial nerve deficit.     Deep Tendon Reflexes: Reflexes are normal and symmetric.  Psychiatric:        Behavior: Behavior normal.        Thought Content: Thought content normal.        Judgment: Judgment normal.      BP 122/83   Pulse 72   Temp 97.8 F (36.6 C)   Ht _0  (1.651 m)   Wt  199 lb (90.3 kg)   SpO2 97%   BMI 33.12 kg/m       Assessment & Plan:  Mauri Belson comes in today with chief complaint of Medication Refill   Diagnosis and orders addressed:  1. Primary hypertension - hydrochlorothiazide (HYDRODIURIL) 25 MG tablet; Take 1 tablet (25 mg total) by mouth daily.  Dispense: 90 tablet; Refill: 4 - CMP14+EGFR - CBC with Differential/Platelet  2. Annual physical exam - CMP14+EGFR - CBC with Differential/Platelet - Lipid panel - TSH  3. Obesity (BMI 30-39.9) - CMP14+EGFR - CBC with Differential/Platelet - phentermine (ADIPEX-P) 37.5 MG tablet; Take 1 tablet (37.5 mg total) by mouth daily  before breakfast.  Dispense: 30 tablet; Refill: 2  4. Depression, major, single episode, mild (HCC) - CMP14+EGFR - CBC with Differential/Platelet  5. GAD (generalized anxiety disorder) - CMP14+EGFR - CBC with Differential/Platelet  6. Weight loss counseling, encounter for Start phentermine  Avoid caffeine Encourage healthy diet and exercise RTO in 3 months   - phentermine (ADIPEX-P) 37.5 MG tablet; Take 1 tablet (37.5 mg total) by mouth daily before breakfast.  Dispense: 30 tablet; Refill: 2   Labs pending Health Maintenance reviewed Diet and exercise encouraged  Follow up plan: 3 months  Evelina Dun, FNP

## 2021-11-22 LAB — CMP14+EGFR
ALT: 17 IU/L (ref 0–32)
AST: 21 IU/L (ref 0–40)
Albumin/Globulin Ratio: 1.9 (ref 1.2–2.2)
Albumin: 4.6 g/dL (ref 3.8–4.8)
Alkaline Phosphatase: 80 IU/L (ref 44–121)
BUN/Creatinine Ratio: 23 (ref 9–23)
BUN: 15 mg/dL (ref 6–24)
Bilirubin Total: 0.3 mg/dL (ref 0.0–1.2)
CO2: 22 mmol/L (ref 20–29)
Calcium: 9.1 mg/dL (ref 8.7–10.2)
Chloride: 101 mmol/L (ref 96–106)
Creatinine, Ser: 0.66 mg/dL (ref 0.57–1.00)
Globulin, Total: 2.4 g/dL (ref 1.5–4.5)
Glucose: 95 mg/dL (ref 70–99)
Potassium: 3.9 mmol/L (ref 3.5–5.2)
Sodium: 138 mmol/L (ref 134–144)
Total Protein: 7 g/dL (ref 6.0–8.5)
eGFR: 112 mL/min/{1.73_m2} (ref >59)

## 2021-11-22 LAB — CBC WITH DIFFERENTIAL/PLATELET
Basophils Absolute: 0 10*3/uL (ref 0.0–0.2)
Basos: 1 %
EOS (ABSOLUTE): 0.1 10*3/uL (ref 0.0–0.4)
Eos: 1 %
Hematocrit: 39.4 % (ref 34.0–46.6)
Hemoglobin: 13.3 g/dL (ref 11.1–15.9)
Immature Grans (Abs): 0 10*3/uL (ref 0.0–0.1)
Immature Granulocytes: 1 %
Lymphocytes Absolute: 2.4 10*3/uL (ref 0.7–3.1)
Lymphs: 35 %
MCH: 31.1 pg (ref 26.6–33.0)
MCHC: 33.8 g/dL (ref 31.5–35.7)
MCV: 92 fL (ref 79–97)
Monocytes Absolute: 0.5 10*3/uL (ref 0.1–0.9)
Monocytes: 7 %
Neutrophils Absolute: 3.9 10*3/uL (ref 1.4–7.0)
Neutrophils: 55 %
Platelets: 282 10*3/uL (ref 150–450)
RBC: 4.28 x10E6/uL (ref 3.77–5.28)
RDW: 12.4 % (ref 11.7–15.4)
WBC: 7 10*3/uL (ref 3.4–10.8)

## 2021-11-22 LAB — LIPID PANEL
Chol/HDL Ratio: 2.9 ratio (ref 0.0–4.4)
Cholesterol, Total: 241 mg/dL — ABNORMAL HIGH (ref 100–199)
HDL: 83 mg/dL (ref >39)
LDL Chol Calc (NIH): 137 mg/dL — ABNORMAL HIGH (ref 0–99)
Triglycerides: 124 mg/dL (ref 0–149)
VLDL Cholesterol Cal: 21 mg/dL (ref 5–40)

## 2021-11-22 LAB — TSH: TSH: 1.32 u[IU]/mL (ref 0.450–4.500)

## 2022-03-20 ENCOUNTER — Ambulatory Visit: Payer: Managed Care, Other (non HMO) | Admitting: Nurse Practitioner

## 2022-03-20 ENCOUNTER — Telehealth: Payer: Managed Care, Other (non HMO) | Admitting: Family

## 2022-03-20 ENCOUNTER — Encounter: Payer: Self-pay | Admitting: Nurse Practitioner

## 2022-03-20 VITALS — BP 114/83 | HR 76 | Temp 97.4°F | Resp 20 | Ht 65.0 in | Wt 196.0 lb

## 2022-03-20 DIAGNOSIS — R55 Syncope and collapse: Secondary | ICD-10-CM | POA: Diagnosis not present

## 2022-03-20 NOTE — Progress Notes (Signed)
   Subjective:    Patient ID: Tricia Castaneda, female    DOB: Jul 26, 1980, 41 y.o.   MRN: [id number]   Chief Complaint: Passed out Sunday (Increased Zoloft to 259m per Dr RHarrington Challengerand then passed out Sunday. When she stood up to get off the golf cart she passed out. Not sure what happened. Hasn't taken the increased dose since then. Hasn't felt right since)   HPI Patient was riding golf. She stood up to get out of golf cart and passed out. She completely went out for less than 10 seconds. She said prior to passing out her body got real tingly. She did not hit her head. She recently had her zoloft increases to 2067mand she was afraid tat is what caused episode. Today she just feels tired.    Review of Systems  Constitutional:  Negative for diaphoresis.  Eyes:  Negative for photophobia, pain and visual disturbance.  Respiratory:  Negative for shortness of breath.   Cardiovascular:  Negative for chest pain, palpitations and leg swelling.  Gastrointestinal:  Negative for abdominal pain.  Endocrine: Negative for polydipsia.  Skin:  Negative for rash.  Neurological:  Positive for syncope. Negative for dizziness, weakness and headaches.  Hematological:  Does not bruise/bleed easily.  All other systems reviewed and are negative.      Objective:   Physical Exam Constitutional:      Appearance: Normal appearance. She is obese.  Cardiovascular:     Rate and Rhythm: Normal rate and regular rhythm.     Heart sounds: Normal heart sounds.  Skin:    General: Skin is warm.  Neurological:     General: No focal deficit present.     Mental Status: She is alert and oriented to person, place, and time.  Psychiatric:        Mood and Affect: Mood normal.        Behavior: Behavior normal.     BP 114/83   Pulse 76   Temp (!) 97.4 F (36.3 C) (Temporal)   Resp 20   Ht _0  (1.651 m)   Wt 196 lb (88.9 kg)   SpO2 98%   BMI 32.62 kg/m        Assessment & Plan:  Tricia Castaneda in today with  chief complaint of Passed out Sunday (Increased Zoloft to 20044mer Dr RosHarrington Challengerd then passed out Sunday. When she stood up to get off the golf cart she passed out. Not sure what happened. Hasn't taken the increased dose since then. Hasn't felt right since)   1. Syncope, unspecified syncope type Force fluids Labs pending - CBC with Differential/Platelet - Thyroid Panel With TSH - CMP14+EGFR    The above assessment and management plan was discussed with the patient. The patient verbalized understanding of and has agreed to the management plan. Patient is aware to call the clinic if symptoms persist or worsen. Patient is aware when to return to the clinic for a follow-up visit. Patient educated on when it is appropriate to go to the emergency department.   Mary-Margaret MarHassell DoneNP

## 2022-03-21 LAB — CMP14+EGFR
ALT: 28 IU/L (ref 0–32)
AST: 32 IU/L (ref 0–40)
Albumin/Globulin Ratio: 1.8 (ref 1.2–2.2)
Albumin: 4.7 g/dL (ref 3.9–4.9)
Alkaline Phosphatase: 76 IU/L (ref 44–121)
BUN/Creatinine Ratio: 19 (ref 9–23)
BUN: 14 mg/dL (ref 6–24)
Bilirubin Total: 0.4 mg/dL (ref 0.0–1.2)
CO2: 21 mmol/L (ref 20–29)
Calcium: 8.5 mg/dL — ABNORMAL LOW (ref 8.7–10.2)
Chloride: 102 mmol/L (ref 96–106)
Creatinine, Ser: 0.75 mg/dL (ref 0.57–1.00)
Globulin, Total: 2.6 g/dL (ref 1.5–4.5)
Glucose: 123 mg/dL — ABNORMAL HIGH (ref 70–99)
Potassium: 3.4 mmol/L — ABNORMAL LOW (ref 3.5–5.2)
Sodium: 140 mmol/L (ref 134–144)
Total Protein: 7.3 g/dL (ref 6.0–8.5)
eGFR: 101 mL/min/{1.73_m2} (ref >59)

## 2022-03-21 LAB — CBC WITH DIFFERENTIAL/PLATELET
Basophils Absolute: 0 10*3/uL (ref 0.0–0.2)
Basos: 0 %
EOS (ABSOLUTE): 0 10*3/uL (ref 0.0–0.4)
Eos: 0 %
Hematocrit: 39.9 % (ref 34.0–46.6)
Hemoglobin: 13.6 g/dL (ref 11.1–15.9)
Immature Grans (Abs): 0 10*3/uL (ref 0.0–0.1)
Immature Granulocytes: 0 %
Lymphocytes Absolute: 1.6 10*3/uL (ref 0.7–3.1)
Lymphs: 20 %
MCH: 30.9 pg (ref 26.6–33.0)
MCHC: 34.1 g/dL (ref 31.5–35.7)
MCV: 91 fL (ref 79–97)
Monocytes Absolute: 0.4 10*3/uL (ref 0.1–0.9)
Monocytes: 5 %
Neutrophils Absolute: 5.9 10*3/uL (ref 1.4–7.0)
Neutrophils: 75 %
Platelets: 269 10*3/uL (ref 150–450)
RBC: 4.4 x10E6/uL (ref 3.77–5.28)
RDW: 12.7 % (ref 11.7–15.4)
WBC: 7.9 10*3/uL (ref 3.4–10.8)

## 2022-03-21 LAB — THYROID PANEL WITH TSH
Free Thyroxine Index: 2 (ref 1.2–4.9)
T3 Uptake Ratio: 30 % (ref 24–39)
T4, Total: 6.6 ug/dL (ref 4.5–12.0)
TSH: 1.8 u[IU]/mL (ref 0.450–4.500)

## 2022-05-24 ENCOUNTER — Telehealth: Payer: Managed Care, Other (non HMO) | Admitting: Physician Assistant

## 2022-05-24 DIAGNOSIS — J019 Acute sinusitis, unspecified: Secondary | ICD-10-CM | POA: Diagnosis not present

## 2022-05-24 MED ORDER — BENZONATATE 100 MG PO CAPS: 100.0000 mg | ORAL_CAPSULE | Freq: Three times a day (TID) | ORAL | 0 refills | Status: DC | PRN

## 2022-05-24 MED ORDER — FLUTICASONE PROPIONATE 50 MCG/ACT NA SUSP: 2.0000 | Freq: Every day | NASAL | 6 refills | Status: DC

## 2022-05-24 NOTE — Progress Notes (Signed)
E-Visit for Sinus Problems  We are sorry that you are not feeling well.  Here is how we plan to help!  Based on what you have shared with me it looks like you have sinusitis.  Sinusitis is inflammation and infection in the sinus cavities of the head.  Based on your presentation I believe you most likely have Acute Viral Sinusitis.This is an infection most likely caused by a virus. There is not specific treatment for viral sinusitis other than to help you with the symptoms until the infection runs its course.  You may use an oral decongestant such as Mucinex D or if you have glaucoma or high blood pressure use plain Mucinex. Saline nasal spray help and can safely be used as often as needed for congestion, I have prescribed: Fluticasone nasal spray two sprays in each nostril once a day and Tessalon to take every 8 hours as needed for cough.   Some authorities believe that zinc sprays or the use of Echinacea may shorten the course of your symptoms.  Sinus infections are not as easily transmitted as other respiratory infection, however we still recommend that you avoid close contact with loved ones, especially the very young and elderly.  Remember to wash your hands thoroughly throughout the day as this is the number one way to prevent the spread of infection!  Home Care: Only take medications as instructed by your medical team. Do not take these medications with alcohol. A steam or ultrasonic humidifier can help congestion.  You can place a towel over your head and breathe in the steam from hot water coming from a faucet. Avoid close contacts especially the very young and the elderly. Cover your mouth when you cough or sneeze. Always remember to wash your hands.  Get Help Right Away If: You develop worsening fever or sinus pain. You develop a severe head ache or visual changes. Your symptoms persist after you have completed your treatment plan.  Make sure you Understand these instructions. Will  watch your condition. Will get help right away if you are not doing well or get worse.   Thank you for choosing an e-visit.  Your e-visit answers were reviewed by a board certified advanced clinical practitioner to complete your personal care plan. Depending upon the condition, your plan could have included both over the counter or prescription medications.  Please review your pharmacy choice. Make sure the pharmacy is open so you can pick up prescription now. If there is a problem, you may contact your provider through [employer] and have the prescription routed to another pharmacy.  Your safety is important to [place]. If you have drug allergies check your prescription carefully.   For the next 24 hours you can use MyChart to ask questions about today's visit, request a non-urgent call back, or ask for a work or school excuse. You will get an email in the next two days asking about your experience. I hope that your e-visit has been valuable and will speed your recovery.   I have spent 5 minutes in review of e-visit questionnaire, review and updating patient chart, medical decision making and response to patient.   Lenise Arena Ward, PA-C

## 2022-08-01 ENCOUNTER — Telehealth: Payer: Managed Care, Other (non HMO) | Admitting: Family Medicine

## 2022-08-01 DIAGNOSIS — J309 Allergic rhinitis, unspecified: Secondary | ICD-10-CM

## 2022-08-01 MED ORDER — PREDNISONE 20 MG PO TABS: 20.0000 mg | ORAL_TABLET | Freq: Two times a day (BID) | ORAL | 0 refills | Status: AC

## 2022-08-01 NOTE — Progress Notes (Signed)
E visit for Allergic Rhinitis We are sorry that you are not feeling well.  Here is how we plan to help!  Based on what you have shared with me it looks like you have Allergic Rhinitis.  Rhinitis is when a reaction occurs that causes nasal congestion, runny nose, sneezing, and itching.  Most types of rhinitis are caused by an inflammation and are associated with symptoms in the eyes ears or throat. There are several types of rhinitis.  The most common are acute rhinitis, which is usually caused by a viral illness, allergic or seasonal rhinitis, and nonallergic or year-round rhinitis.  Nasal allergies occur certain times of the year.  Allergic rhinitis is caused when allergens in the air trigger the release of histamine in the body.  Histamine causes itching, swelling, and fluid to build up in the fragile linings of the nasal passages, sinuses and eyelids.  An itchy nose and clear discharge are common.  I recommend the following over the counter treatments: You should take a daily dose of antihistamine  I also would recommend a nasal spray: Flonase 2 sprays into each nostril once daily  I have also sent prednisone to relieve symptoms until a daily dose of antihistamine is effective.  HOME CARE:  You can use an over-the-counter saline nasal spray as needed Avoid areas where there is heavy dust, mites, or molds Stay indoors on windy days during the pollen season Keep windows closed in home, at least in bedroom; use air conditioner. Use high-efficiency house air filter Keep windows closed in car, turn AC on re-circulate Avoid playing out with dog during pollen season  GET HELP RIGHT AWAY IF:  If your symptoms do not improve within 10 days You become short of breath You develop yellow or green discharge from your nose for over 3 days You have coughing fits  MAKE SURE YOU:  Understand these instructions Will watch your condition Will get help right away if you are not doing well or get  worse  Thank you for choosing an e-visit. Your e-visit answers were reviewed by a board certified advanced clinical practitioner to complete your personal care plan. Depending upon the condition, your plan could have included both over the counter or prescription medications. Please review your pharmacy choice. Be sure that the pharmacy you have chosen is open so that you can pick up your prescription now.  If there is a problem you may message your provider in [place] to have the prescription routed to another pharmacy. Your safety is important to [place]. If you have drug allergies check your prescription carefully.  For the next 24 hours, you can use MyChart to ask questions about today's visit, request a non-urgent call back, or ask for a work or school excuse from your e-visit provider. You will get an email in the next two days asking about your experience. I hope that your e-visit has been valuable and will speed your recovery.    have provided 5 minutes of non face to face time during this encounter for chart review and documentation.

## 2022-09-15 ENCOUNTER — Encounter: Payer: Self-pay | Admitting: Family

## 2022-09-15 ENCOUNTER — Ambulatory Visit: Payer: Managed Care, Other (non HMO) | Admitting: Family

## 2022-09-15 VITALS — BP 111/74 | HR 92 | Temp 97.4°F | Ht 65.0 in | Wt 206.0 lb

## 2022-09-15 DIAGNOSIS — E669 Obesity, unspecified: Secondary | ICD-10-CM | POA: Diagnosis not present

## 2022-09-15 DIAGNOSIS — I10 Essential (primary) hypertension: Secondary | ICD-10-CM | POA: Diagnosis not present

## 2022-09-15 DIAGNOSIS — Z713 Dietary counseling and surveillance: Secondary | ICD-10-CM

## 2022-09-15 MED ORDER — PHENTERMINE HCL 37.5 MG PO CAPS: 37.5000 mg | ORAL_CAPSULE | ORAL | 2 refills | Status: DC

## 2022-09-15 MED ORDER — SEMAGLUTIDE(0.25 OR 0.5MG/DOS) 2 MG/3ML ~~LOC~~ SOPN: 0.2500 mg | PEN_INJECTOR | SUBCUTANEOUS | 2 refills | Status: DC

## 2022-09-15 NOTE — Patient Instructions (Signed)
Calorie Counting for Weight Loss Calories are units of energy. Your body needs a certain number of calories from food to keep going throughout the day. When you eat or drink more calories than your body needs, your body stores the extra calories mostly as fat. When you eat or drink fewer calories than your body needs, your body burns fat to get the energy it needs. Calorie counting means keeping track of how many calories you eat and drink each day. Calorie counting can be helpful if you need to lose weight. If you eat fewer calories than your body needs, you should lose weight. Ask your health care provider what a healthy weight is for you. For calorie counting to work, you will need to eat the right number of calories each day to lose a healthy amount of weight per week. A dietitian can help you figure out how many calories you need in a day and will suggest ways to reach your calorie goal. A healthy amount of weight to lose each week is usually 1-2 lb (0.5-0.9 kg). This usually means that your daily calorie intake should be reduced by 500-750 calories. Eating 1,200-1,500 calories a day can help most women lose weight. Eating 1,500-1,800 calories a day can help most men lose weight. What do I need to know about calorie counting? Work with your health care provider or dietitian to determine how many calories you should get each day. To meet your daily calorie goal, you will need to: Find out how many calories are in each food that you would like to eat. Try to do this before you eat. Decide how much of the food you plan to eat. Keep a food log. Do this by writing down what you ate and how many calories it had. To successfully lose weight, it is important to balance calorie counting with a healthy lifestyle that includes regular activity. Where do I find calorie information?  The number of calories in a food can be found on a Nutrition Facts label. If a food does not have a Nutrition Facts label, try  to look up the calories online or ask your dietitian for help. Remember that calories are listed per serving. If you choose to have more than one serving of a food, you will have to multiply the calories per serving by the number of servings you plan to eat. For example, the label on a package of bread might say that a serving size is 1 slice and that there are 90 calories in a serving. If you eat 1 slice, you will have eaten 90 calories. If you eat 2 slices, you will have eaten 180 calories. How do I keep a food log? After each time that you eat, record the following in your food log as soon as possible: What you ate. Be sure to include toppings, sauces, and other extras on the food. How much you ate. This can be measured in cups, ounces, or number of items. How many calories were in each food and drink. The total number of calories in the food you ate. Keep your food log near you, such as in a pocket-sized notebook or on an app or website on your mobile phone. Some programs will calculate calories for you and show you how many calories you have left to meet your daily goal. What are some portion-control tips? Know how many calories are in a serving. This will help you know how many servings you can have of a certain   food. Use a measuring cup to measure serving sizes. You could also try weighing out portions on a kitchen scale. With time, you will be able to estimate serving sizes for some foods. Take time to put servings of different foods on your favorite plates or in your favorite bowls and cups so you know what a serving looks like. Try not to eat straight from a food's packaging, such as from a bag or box. Eating straight from the package makes it hard to see how much you are eating and can lead to overeating. Put the amount you would like to eat in a cup or on a plate to make sure you are eating the right portion. Use smaller plates, glasses, and bowls for smaller portions and to prevent  overeating. Try not to multitask. For example, avoid watching TV or using your computer while eating. If it is time to eat, sit down at a table and enjoy your food. This will help you recognize when you are full. It will also help you be more mindful of what and how much you are eating. What are tips for following this plan? Reading food labels Check the calorie count compared with the serving size. The serving size may be smaller than what you are used to eating. Check the source of the calories. Try to choose foods that are high in protein, fiber, and vitamins, and low in saturated fat, trans fat, and sodium. Shopping Read nutrition labels while you shop. This will help you make healthy decisions about which foods to buy. Pay attention to nutrition labels for low-fat or fat-free foods. These foods sometimes have the same number of calories or more calories than the full-fat versions. They also often have added sugar, starch, or salt to make up for flavor that was removed with the fat. Make a grocery list of lower-calorie foods and stick to it. Cooking Try to cook your favorite foods in a healthier way. For example, try baking instead of frying. Use low-fat dairy products. Meal planning Use more fruits and vegetables. One-half of your plate should be fruits and vegetables. Include lean proteins, such as chicken, turkey, and fish. Lifestyle Each week, aim to do one of the following: 150 minutes of moderate exercise, such as walking. 75 minutes of vigorous exercise, such as running. General information Know how many calories are in the foods you eat most often. This will help you calculate calorie counts faster. Find a way of tracking calories that works for you. Get creative. Try different apps or programs if writing down calories does not work for you. What foods should I eat?  Eat nutritious foods. It is better to have a nutritious, high-calorie food, such as an avocado, than a food with  few nutrients, such as a bag of potato chips. Use your calories on foods and drinks that will fill you up and will not leave you hungry soon after eating. Examples of foods that fill you up are nuts and nut butters, vegetables, lean proteins, and high-fiber foods such as whole grains. High-fiber foods are foods with more than 5 g of fiber per serving. Pay attention to calories in drinks. Low-calorie drinks include water and unsweetened drinks. The items listed above may not be a complete list of foods and beverages you can eat. Contact a dietitian for more information. What foods should I limit? Limit foods or drinks that are not good sources of vitamins, minerals, or protein or that are high in unhealthy fats. These   include: Candy. Other sweets. Sodas, specialty coffee drinks, alcohol, and juice. The items listed above may not be a complete list of foods and beverages you should avoid. Contact a dietitian for more information. How do I count calories when eating out? Pay attention to portions. Often, portions are much larger when eating out. Try these tips to keep portions smaller: Consider sharing a meal instead of getting your own. If you get your own meal, eat only half of it. Before you start eating, ask for a container and put half of your meal into it. When available, consider ordering smaller portions from the menu instead of full portions. Pay attention to your food and drink choices. Knowing the way food is cooked and what is included with the meal can help you eat fewer calories. If calories are listed on the menu, choose the lower-calorie options. Choose dishes that include vegetables, fruits, whole grains, low-fat dairy products, and lean proteins. Choose items that are boiled, broiled, grilled, or steamed. Avoid items that are buttered, battered, fried, or served with cream sauce. Items labeled as crispy are usually fried, unless stated otherwise. Choose water, low-fat milk,  unsweetened iced tea, or other drinks without added sugar. If you want an alcoholic beverage, choose a lower-calorie option, such as a glass of wine or light beer. Ask for dressings, sauces, and syrups on the side. These are usually high in calories, so you should limit the amount you eat. If you want a salad, choose a garden salad and ask for grilled meats. Avoid extra toppings such as bacon, cheese, or fried items. Ask for the dressing on the side, or ask for olive oil and vinegar or lemon to use as dressing. Estimate how many servings of a food you are given. Knowing serving sizes will help you be aware of how much food you are eating at restaurants. Where to find more information Centers for Disease Control and Prevention: www.cdc.gov [place] Department of Agriculture: myplate.gov Summary Calorie counting means keeping track of how many calories you eat and drink each day. If you eat fewer calories than your body needs, you should lose weight. A healthy amount of weight to lose per week is usually 1-2 lb (0.5-0.9 kg). This usually means reducing your daily calorie intake by 500-750 calories. The number of calories in a food can be found on a Nutrition Facts label. If a food does not have a Nutrition Facts label, try to look up the calories online or ask your dietitian for help. Use smaller plates, glasses, and bowls for smaller portions and to prevent overeating. Use your calories on foods and drinks that will fill you up and not leave you hungry shortly after a meal. This information is not intended to replace advice given to you by your health care provider. Make sure you discuss any questions you have with your health care provider. Document Revised: 06/15/2019 Document Reviewed: 06/15/2019 [employer] Patient Education  2023 [employer]  

## 2022-09-15 NOTE — Progress Notes (Signed)
Subjective:    Patient ID: Tricia Castaneda, female    DOB: 03-30-1981, 42 y.o.   MRN: [id number]  Chief Complaint  Patient presents with   Weight Loss   Pt presents to the office today to discuss weight loss. She reports she called her insurance and states it would cover Ozempic and phentermine. Reports she does not do any scheduled exercises and working from home.   She has tried multiple diets without success.  Hypertension This is a chronic problem. The current episode started more than 1 year ago. The problem has been resolved since onset. The problem is controlled. Pertinent negatives include no malaise/fatigue, peripheral edema or shortness of breath. Risk factors for coronary artery disease include dyslipidemia and obesity. The current treatment provides moderate improvement.      Review of Systems  Constitutional:  Negative for malaise/fatigue.  Respiratory:  Negative for shortness of breath.   All other systems reviewed and are negative.      Objective:   Physical Exam Vitals reviewed.  Constitutional:      General: She is not in acute distress.    Appearance: She is well-developed. She is obese.  HENT:     Head: Normocephalic and atraumatic.     Right Ear: Tympanic membrane normal.     Left Ear: Tympanic membrane normal.  Eyes:     Pupils: Pupils are equal, round, and reactive to light.  Neck:     Thyroid: No thyromegaly.  Cardiovascular:     Rate and Rhythm: Normal rate and regular rhythm.     Heart sounds: Normal heart sounds. No murmur heard. Pulmonary:     Effort: Pulmonary effort is normal. No respiratory distress.     Breath sounds: Normal breath sounds. No wheezing.  Abdominal:     General: Bowel sounds are normal. There is no distension.     Palpations: Abdomen is soft.     Tenderness: There is no abdominal tenderness.  Musculoskeletal:        General: No tenderness. Normal range of motion.     Cervical back: Normal range of motion and neck supple.   Skin:    General: Skin is warm and dry.  Neurological:     Mental Status: She is alert and oriented to person, place, and time.     Cranial Nerves: No cranial nerve deficit.     Deep Tendon Reflexes: Reflexes are normal and symmetric.  Psychiatric:        Behavior: Behavior normal.        Thought Content: Thought content normal.        Judgment: Judgment normal.       BP 111/74   Pulse 92   Temp (!) 97.4 F (36.3 C) (Temporal)   Ht 5\' 5"  (1.651 m)   Wt 206 lb (93.4 kg)   SpO2 97%   BMI 34.28 kg/m      Assessment & Plan:  [name] comes in today with chief complaint of Weight Loss   Diagnosis and orders addressed:  1. Weight loss counseling, encounter for - Semaglutide,0.25 or 0.5MG /DOS, 2 MG/3ML SOPN; Inject 0.25 mg into the skin once a week.  Dispense: 3 mL; Refill: 2 - phentermine 37.5 MG capsule; Take 1 capsule (37.5 mg total) by mouth every morning.  Dispense: 30 capsule; Refill: 2  2. Obesity (BMI 30-39.9) - Semaglutide,0.25 or 0.5MG /DOS, 2 MG/3ML SOPN; Inject 0.25 mg into the skin once a week.  Dispense: 3 mL; Refill: 2 - phentermine 37.5  MG capsule; Take 1 capsule (37.5 mg total) by mouth every morning.  Dispense: 30 capsule; Refill: 2  3. Primary hypertension - Semaglutide,0.25 or 0.5MG /DOS, 2 MG/3ML SOPN; Inject 0.25 mg into the skin once a week.  Dispense: 3 mL; Refill: 2 - phentermine 37.5 MG capsule; Take 1 capsule (37.5 mg total) by mouth every morning.  Dispense: 30 capsule; Refill: 2   Will send in Ozempic to see if insurance approves. If not start phentermine. Low fat diet  Encourage exercise  Follow up 3 months    [name], FNP

## 2022-09-18 ENCOUNTER — Telehealth: Payer: Self-pay

## 2022-09-18 NOTE — Telephone Encounter (Signed)
Tricia Castaneda (Key: [id number]) Rx #: [id number] Ozempic (0.25 or 0.5 MG/DOSE) 2MG Ronny Bacon pen-injectors Form Passenger transport manager [place] Form [phone] NCPDP) Created 3 days ago Sent to Plan 3 minutes ago Plan Response 3 minutes ago Submit Clinical Questions less than a minute ago Determination Wait for Determination Please wait for [employer] ESI 2017 to return a determination.

## 2022-09-21 ENCOUNTER — Other Ambulatory Visit: Payer: Self-pay | Admitting: Family

## 2022-09-21 DIAGNOSIS — I10 Essential (primary) hypertension: Secondary | ICD-10-CM

## 2022-09-21 DIAGNOSIS — Z713 Dietary counseling and surveillance: Secondary | ICD-10-CM

## 2022-09-21 DIAGNOSIS — E669 Obesity, unspecified: Secondary | ICD-10-CM

## 2022-09-22 NOTE — Telephone Encounter (Signed)
Please let the patient know Ozempic was denied and to start the phentermine as discussed at her office visit.

## 2022-09-22 NOTE — Telephone Encounter (Signed)
Ozempic was denied  Jenie l Coats (Key: [id number]) Rx #: [id number] Ozempic (0.25 or 0.5 MG/DOSE) 2MG Ronny Bacon pen-injectors Form Passenger transport manager [place] Form [phone] NCPDP) Created 7 days ago Sent to Plan 4 days ago Plan Response 4 days ago Submit Clinical Questions 4 days ago Determination Unfavorable 24 hours ago

## 2022-09-22 NOTE — Telephone Encounter (Signed)
Lmtcb.

## 2022-09-22 NOTE — Telephone Encounter (Signed)
Patient not eligible for Ozempic, only indicated for diabetes

## 2022-10-27 ENCOUNTER — Encounter: Payer: Self-pay | Admitting: Family

## 2022-10-27 ENCOUNTER — Telehealth: Payer: Managed Care, Other (non HMO) | Admitting: Family

## 2022-10-27 DIAGNOSIS — J029 Acute pharyngitis, unspecified: Secondary | ICD-10-CM | POA: Diagnosis not present

## 2022-10-27 MED ORDER — CETIRIZINE HCL 10 MG PO TABS
10.0000 mg | ORAL_TABLET | Freq: Every day | ORAL | 1 refills | Status: DC
Start: 2022-10-27 — End: 2023-07-19

## 2022-10-27 MED ORDER — AMOXICILLIN-POT CLAVULANATE 875-125 MG PO TABS
1.0000 | ORAL_TABLET | Freq: Two times a day (BID) | ORAL | 0 refills | Status: DC
Start: 2022-10-27 — End: 2022-12-18

## 2022-10-27 NOTE — Progress Notes (Signed)
Virtual Visit Consent   Tricia Castaneda, you are scheduled for a virtual visit with a [facility] provider today. Just as with appointments in the office, your consent must be obtained to participate. Your consent will be active for this visit and any virtual visit you may have with one of our providers in the next 365 days. If you have a MyChart account, a copy of this consent can be sent to you electronically.  As this is a virtual visit, video technology does not allow for your provider to perform a traditional examination. This may limit your provider's ability to fully assess your condition. If your provider identifies any concerns that need to be evaluated in person or the need to arrange testing (such as labs, EKG, etc.), we will make arrangements to do so. Although advances in technology are sophisticated, we cannot ensure that it will always work on either your end or our end. If the connection with a video visit is poor, the visit may have to be switched to a telephone visit. With either a video or telephone visit, we are not always able to ensure that we have a secure connection.  By engaging in this virtual visit, you consent to the provision of healthcare and authorize for your insurance to be billed (if applicable) for the services provided during this visit. Depending on your insurance coverage, you may receive a charge related to this service.  I need to obtain your verbal consent now. Are you willing to proceed with your visit today? Diamonique Menser has provided verbal consent on 10/27/2022 for a virtual visit (video or telephone). [name], FNP  Date: 10/27/2022 11:50 AM  Virtual Visit via Video Note   I, [name], connected with  Tricia Castaneda  ([id number], [birth year]) on 10/27/22 at  5:30 PM EDT by a video-enabled telemedicine application and verified that I am speaking with the correct person using two identifiers.  Location: Patient: Virtual Visit Location Patient:  Home Provider: Virtual Visit Location Provider: Office/Clinic   I discussed the limitations of evaluation and management by telemedicine and the availability of in person appointments. The patient expressed understanding and agreed to proceed.    History of Present Illness: Tricia Castaneda is a 42 y.o. who identifies as a female who was assigned female at birth, and is being seen today for sore throat that started 3 days ago.  HPI: Sore Throat  This is a new problem. The current episode started in the past 7 days. The problem has been unchanged. There has been no fever. The pain is at a severity of 7/10. The pain is mild. Associated symptoms include ear pain and trouble swallowing. Pertinent negatives include no congestion, coughing, drooling, headaches, shortness of breath or swollen glands. Associated symptoms comments: Sinus pressure . She has tried acetaminophen for the symptoms. The treatment provided mild relief.    Problems:  Patient Active Problem List   Diagnosis Date Noted   Depression, major, single episode, mild (HCC) 11/07/2019   GAD (generalized anxiety disorder) 11/07/2019   Obesity (BMI 30-39.9) 08/25/2018   Hypertension 08/16/2018   Cholecystitis, acute with cholelithiasis 07/20/2017    Allergies: No Known Allergies Medications:  Current Outpatient Medications:    amoxicillin-clavulanate (AUGMENTIN) 875-125 MG tablet, Take 1 tablet by mouth 2 (two) times daily., Disp: 14 tablet, Rfl: 0   cetirizine (ZYRTEC ALLERGY) 10 MG tablet, Take 1 tablet (10 mg total) by mouth daily., Disp: 90 tablet, Rfl: 1   clindamycin-benzoyl peroxide (BENZACLIN) gel, Apply topically  2 (two) times daily., Disp: 50 g, Rfl: 2   fluticasone (FLONASE) 50 MCG/ACT nasal spray, Place 2 sprays into both nostrils daily., Disp: 16 g, Rfl: 6   hydrochlorothiazide (HYDRODIURIL) 25 MG tablet, Take 1 tablet (25 mg total) by mouth daily., Disp: 90 tablet, Rfl: 4   phentermine 37.5 MG capsule, Take 1 capsule (37.5  mg total) by mouth every morning., Disp: 30 capsule, Rfl: 2   Semaglutide,0.25 or 0.5MG /DOS, 2 MG/3ML SOPN, Inject 0.25 mg into the skin once a week., Disp: 3 mL, Rfl: 2  Observations/Objective: Patient is well-developed, well-nourished in no acute distress.  Resting comfortably  at home.  Head is normocephalic, atraumatic.  No labored breathing.  Speech is clear and coherent with logical content.  Patient is alert and oriented at baseline.    Assessment and Plan: 1. Acute pharyngitis, unspecified etiology - cetirizine (ZYRTEC ALLERGY) 10 MG tablet; Take 1 tablet (10 mg total) by mouth daily.  Dispense: 90 tablet; Refill: 1 - amoxicillin-clavulanate (AUGMENTIN) 875-125 MG tablet; Take 1 tablet by mouth 2 (two) times daily.  Dispense: 14 tablet; Refill: 0  Start zyrtec and flonase  If no improvement in 2-3 days start Augmentin - Take meds as prescribed - Use a cool mist humidifier  -Use saline nose sprays frequently -Force fluids -For any cough or congestion  Use plain Mucinex- regular strength or max strength is fine -For fever or aces or pains- take tylenol or ibuprofen. -Throat lozenges if help -New toothbrush in 3 days Follow up if symptoms worsen or do not improve   Follow Up Instructions: I discussed the assessment and treatment plan with the patient. The patient was provided an opportunity to ask questions and all were answered. The patient agreed with the plan and demonstrated an understanding of the instructions.  A copy of instructions were sent to the patient via MyChart unless otherwise noted below.     The patient was advised to call back or seek an in-person evaluation if the symptoms worsen or if the condition fails to improve as anticipated.  Time:  I spent 8 minutes with the patient via telehealth technology discussing the above problems/concerns.    [name], FNP

## 2022-12-18 ENCOUNTER — Encounter: Payer: Self-pay | Admitting: Family

## 2022-12-18 ENCOUNTER — Ambulatory Visit: Payer: Managed Care, Other (non HMO) | Admitting: Family

## 2022-12-18 VITALS — BP 119/83 | HR 97 | Temp 97.5°F | Ht 64.0 in | Wt 196.9 lb

## 2022-12-18 DIAGNOSIS — E669 Obesity, unspecified: Secondary | ICD-10-CM | POA: Diagnosis not present

## 2022-12-18 DIAGNOSIS — I10 Essential (primary) hypertension: Secondary | ICD-10-CM | POA: Diagnosis not present

## 2022-12-18 DIAGNOSIS — F320 Major depressive disorder, single episode, mild: Secondary | ICD-10-CM

## 2022-12-18 DIAGNOSIS — F411 Generalized anxiety disorder: Secondary | ICD-10-CM

## 2022-12-18 DIAGNOSIS — Z0001 Encounter for general adult medical examination with abnormal findings: Secondary | ICD-10-CM

## 2022-12-18 DIAGNOSIS — Z716 Tobacco abuse counseling: Secondary | ICD-10-CM

## 2022-12-18 DIAGNOSIS — Z713 Dietary counseling and surveillance: Secondary | ICD-10-CM

## 2022-12-18 DIAGNOSIS — Z0000 Encounter for general adult medical examination without abnormal findings: Secondary | ICD-10-CM

## 2022-12-18 MED ORDER — HYDROCHLOROTHIAZIDE 25 MG PO TABS
25.0000 mg | ORAL_TABLET | Freq: Every day | ORAL | 4 refills | Status: DC
Start: 2022-12-18 — End: 2024-03-06

## 2022-12-18 MED ORDER — PHENTERMINE HCL 37.5 MG PO CAPS
37.5000 mg | ORAL_CAPSULE | ORAL | 2 refills | Status: DC
Start: 2022-12-18 — End: 2023-04-05

## 2022-12-18 MED ORDER — BUPROPION HCL ER (SR) 100 MG PO TB12
100.0000 mg | ORAL_TABLET | Freq: Two times a day (BID) | ORAL | 1 refills | Status: DC
Start: 2022-12-18 — End: 2023-04-05

## 2022-12-18 NOTE — Progress Notes (Signed)
Subjective:    Patient ID: Tricia Castaneda, female    DOB: 08-02-1980, 42 y.o.   MRN: [id number]  Chief Complaint  Patient presents with   Medical Management of Chronic Issues   PT presents to the office today for CPE without pap. She is followed by Gyn every year and had pap and mammogram 11/23/22.  She reports she started smoking and has been taking 1/2 pack a day. Requesting medications.   She has taken phentermine on and off over the last three months. She has lost 10 lbs since her last visit.      12/18/2022   10:17 AM 09/15/2022    2:52 PM 03/20/2022   12:12 PM  Last 3 Weights  Weight (lbs) 196 lb 14.4 oz 206 lb 196 lb  Weight (kg) 89.313 kg 93.441 kg 88.905 kg     Hypertension This is a chronic problem. The current episode started more than 1 year ago. The problem has been resolved since onset. The problem is controlled. Associated symptoms include anxiety. Pertinent negatives include no malaise/fatigue, peripheral edema or shortness of breath. Risk factors for coronary artery disease include dyslipidemia and obesity. The current treatment provides moderate improvement.  Anxiety Presents for follow-up visit. Symptoms include depressed mood, excessive worry and nervous/anxious behavior. Patient reports no shortness of breath. Symptoms occur occasionally. The severity of symptoms is moderate.    Depression        This is a chronic problem.  The current episode started more than 1 year ago.   The problem occurs intermittently.  Associated symptoms include fatigue, helplessness, hopelessness and sad.  Past treatments include nothing.  Past medical history includes anxiety.       Review of Systems  Constitutional:  Positive for fatigue. Negative for malaise/fatigue.  Respiratory:  Negative for shortness of breath.   Psychiatric/Behavioral:  Positive for depression. The patient is nervous/anxious.   All other systems reviewed and are negative.      Objective:   Physical  Exam Vitals reviewed.  Constitutional:      General: She is not in acute distress.    Appearance: She is well-developed. She is obese.  HENT:     Head: Normocephalic and atraumatic.     Right Ear: Tympanic membrane normal.     Left Ear: Tympanic membrane normal.  Eyes:     Pupils: Pupils are equal, round, and reactive to light.  Neck:     Thyroid: No thyromegaly.  Cardiovascular:     Rate and Rhythm: Normal rate and regular rhythm.     Heart sounds: Normal heart sounds. No murmur heard. Pulmonary:     Effort: Pulmonary effort is normal. No respiratory distress.     Breath sounds: Normal breath sounds. No wheezing.  Abdominal:     General: Bowel sounds are normal. There is no distension.     Palpations: Abdomen is soft.     Tenderness: There is no abdominal tenderness.  Musculoskeletal:        General: No tenderness. Normal range of motion.     Cervical back: Normal range of motion and neck supple.  Skin:    General: Skin is warm and dry.  Neurological:     Mental Status: She is alert and oriented to person, place, and time.     Cranial Nerves: No cranial nerve deficit.     Deep Tendon Reflexes: Reflexes are normal and symmetric.  Psychiatric:        Behavior: Behavior normal.  Thought Content: Thought content normal.        Judgment: Judgment normal.     BP 119/83   Pulse 97   Temp (!) 97.5 F (36.4 C) (Temporal)   Ht 5\' 4"  (1.626 m)   Wt 196 lb 14.4 oz (89.3 kg)   SpO2 98%   BMI 33.80 kg/m      Assessment & Plan:  Raelea Yapp comes in today with chief complaint of Medical Management of Chronic Issues   Diagnosis and orders addressed:  1. Primary hypertension - hydrochlorothiazide (HYDRODIURIL) 25 MG tablet; Take 1 tablet (25 mg total) by mouth daily.  Dispense: 90 tablet; Refill: 4 - CMP14+EGFR - CBC with Differential/Platelet - phentermine 37.5 MG capsule; Take 1 capsule (37.5 mg total) by mouth every morning.  Dispense: 30 capsule; Refill: 2  2.  Annual physical exam - CMP14+EGFR - CBC with Differential/Platelet - Lipid panel - TSH  3. Obesity (BMI 30-39.9) - CMP14+EGFR - CBC with Differential/Platelet - phentermine 37.5 MG capsule; Take 1 capsule (37.5 mg total) by mouth every morning.  Dispense: 30 capsule; Refill: 2  4. GAD (generalized anxiety disorder) - CMP14+EGFR - CBC with Differential/Platelet - buPROPion ER (WELLBUTRIN SR) 100 MG 12 hr tablet; Take 1 tablet (100 mg total) by mouth 2 (two) times daily.  Dispense: 180 tablet; Refill: 1  5. Depression, major, single episode, mild (HCC) - CMP14+EGFR - CBC with Differential/Platelet - buPROPion ER (WELLBUTRIN SR) 100 MG 12 hr tablet; Take 1 tablet (100 mg total) by mouth 2 (two) times daily.  Dispense: 180 tablet; Refill: 1  6. Weight loss counseling, encounter for - CMP14+EGFR - CBC with Differential/Platelet - phentermine 37.5 MG capsule; Take 1 capsule (37.5 mg total) by mouth every morning.  Dispense: 30 capsule; Refill: 2 - buPROPion ER (WELLBUTRIN SR) 100 MG 12 hr tablet; Take 1 tablet (100 mg total) by mouth 2 (two) times daily.  Dispense: 180 tablet; Refill: 1  7. Encounter for smoking cessation counseling - buPROPion ER (WELLBUTRIN SR) 100 MG 12 hr tablet; Take 1 tablet (100 mg total) by mouth 2 (two) times daily.  Dispense: 180 tablet; Refill: 1   Labs pending Will start Wellbutrin SR BID for smoking cessation, GAD, Depression, and weight loss. Will give phentermine to continue to take as needed.  Encourage exercise  Health Maintenance reviewed Diet and exercise encouraged  Follow up plan: 3 months    [name], FNP

## 2022-12-18 NOTE — Patient Instructions (Signed)

## 2023-04-05 ENCOUNTER — Ambulatory Visit: Payer: Managed Care, Other (non HMO) | Admitting: Family

## 2023-04-05 ENCOUNTER — Encounter: Payer: Self-pay | Admitting: Family

## 2023-04-05 VITALS — BP 119/81 | HR 82 | Temp 96.9°F | Ht 64.0 in | Wt 195.0 lb

## 2023-04-05 DIAGNOSIS — J0100 Acute maxillary sinusitis, unspecified: Secondary | ICD-10-CM

## 2023-04-05 DIAGNOSIS — L509 Urticaria, unspecified: Secondary | ICD-10-CM | POA: Diagnosis not present

## 2023-04-05 MED ORDER — HYDROXYZINE PAMOATE 25 MG PO CAPS
25.0000 mg | ORAL_CAPSULE | Freq: Three times a day (TID) | ORAL | 0 refills | Status: DC | PRN
Start: 2023-04-05 — End: 2023-07-19

## 2023-04-05 MED ORDER — DOXYCYCLINE HYCLATE 100 MG PO TABS
100.0000 mg | ORAL_TABLET | Freq: Two times a day (BID) | ORAL | 0 refills | Status: DC
Start: 2023-04-05 — End: 2023-07-19

## 2023-04-05 MED ORDER — METHYLPREDNISOLONE ACETATE 80 MG/ML IJ SUSP
80.0000 mg | Freq: Once | INTRAMUSCULAR | Status: AC
Start: 2023-04-05 — End: 2023-04-05
  Administered 2023-04-05: 80 mg via INTRAMUSCULAR

## 2023-04-05 NOTE — Patient Instructions (Signed)
Hives Hives (urticaria) are itchy, red, swollen areas of skin. They can show up on any part of the body. They often fade within 24 hours (acute hives). If you get new hives after the old ones fade and the cycle goes on for many days or weeks, it is called chronic hives. Hives do not spread from person to person (are not contagious). Hives can happen when your body reacts to something you are allergic to (allergen) or to something that irritates your skin. When you are exposed to something that triggers hives, your body releases a chemical called histamine. This causes redness, itching, and swelling. Hives can show up right after you are exposed to a trigger or hours later. What are the causes? Hives may be caused by: Food allergies. Insect bites or stings. Allergies to pollen or pets. Spending time in sunlight, heat, or cold (exposure). Exercise. Stress. You can also get hives from other conditions and treatments. These include: Viruses, such as the common cold. Bacterial infections, such as urinary tract infections and strep throat. Certain medicines. Contact with latex or chemicals. Allergy shots. Blood transfusions. In some cases, the cause of hives is not known (idiopathic hives). What increases the risk? You are more likely to get hives if: You are female. You have food allergies. Hives are more common if you are allergic to citrus fruits, milk, eggs, peanuts, tree nuts, or shellfish. You are allergic to: Medicines. Latex. Insects. Animals. Pollen. What are the signs or symptoms? Common symptoms of hives include raised, itchy, red or white bumps or patches on your skin. These areas may: Become large and swollen (welts). Quickly change shape and location. This may happen more than once. Be separate hives or connect over a large area of skin. Sting or become painful. Turn white when pressed in the center (blanch). In severe cases, your hands, feet, and face may also become  swollen. This may happen if hives form deeper in your skin. How is this diagnosed? Hives may be diagnosed based on your symptoms, medical history, and a physical exam. You may have skin, pee (urine), or blood tests done. These can help find out what is causing your hives and rule out other health issues. You may also have a biopsy done. This is when a small piece of skin is removed for testing. How is this treated? Treatment for hives depends on the cause and on how severe your symptoms are. You may be told to use cool, wet cloths (cool compresses) or to take cool showers to relieve itching. Treatment may also include: Medicines to help: Relieve itching (antihistamines). Reduce swelling (corticosteroids). Treat infection (antibiotics). An injectable medicine called omalizumab. You may need this if you have chronic idiopathic hives and still have symptoms even after you are treated with antihistamines. In severe cases, you may need to use a device filled with medicine that gives an emergency shot of epinephrine (auto-injector pen) to prevent a very bad allergic reaction (anaphylactic reaction). Follow these instructions at home: Medicines Take and apply over-the-counter and prescription medicines only as told by your health care provider. If you were prescribed antibiotics, take them as told by your provider. Do not stop using the antibiotic even if you start to feel better. Skin care Apply cool compresses to the affected areas. Do not scratch or rub your skin. General instructions Do not take hot showers or baths. This can make itching worse. Do not wear tight-fitting clothing. Use sunscreen. Wear protective clothing when you are outside. Avoid   anything that causes your hives. Keep a journal to help track what causes your hives. Write down: What medicines you take. What you eat and drink. What products you use on your skin. Keep all follow-up visits. Your provider will track how well  treatment is working. Contact a health care provider if: Your symptoms do not get better with medicine. Your joints are painful or swollen. You have a fever. You have pain in your abdomen. Get help right away if: Your tongue, lips, or eyelids swell. Your chest or throat feels tight. You have trouble breathing or swallowing. These symptoms may be an emergency. Use the auto-injector pen right away. Then call 911. Do not wait to see if the symptoms will go away. Do not drive yourself to the hospital. This information is not intended to replace advice given to you by your health care provider. Make sure you discuss any questions you have with your health care provider. Document Revised: 01/29/2022 Document Reviewed: 01/20/2022 Elsevier Patient Education  2024 [employer]  

## 2023-04-05 NOTE — Progress Notes (Addendum)
Subjective:    Patient ID: Tricia Castaneda, female    DOB: Dec 20, 1980, 42 y.o.   MRN: [id number]  Chief Complaint  Patient presents with   Cough    Since last Sunday ears stopped up    Urticaria    Started yesterday    Pt presents to the office today with hives that started yesterday morning.   She reports she has sinus congestion and pain that started over a week ago.  Cough This is a new problem. Associated symptoms include ear pain and headaches. Pertinent negatives include no sore throat or shortness of breath.  Urticaria This is a new problem. The current episode started yesterday. The problem is unchanged. The affected locations include the chest, torso, abdomen, groin, left upper leg, right lower leg and right upper leg. The rash is characterized by itchiness. She was exposed to nothing. Associated symptoms include congestion and coughing. Pertinent negatives include no shortness of breath or sore throat. Past treatments include antihistamine. The treatment provided mild relief.  Sinus Problem This is a new problem. The current episode started in the past 7 days. The problem has been gradually worsening since onset. There has been no fever. Her pain is at a severity of 8/10. The pain is moderate. Associated symptoms include congestion, coughing, ear pain, headaches and sinus pressure. Pertinent negatives include no shortness of breath, sneezing or sore throat. Past treatments include acetaminophen and oral decongestants. The treatment provided mild relief.      Review of Systems  HENT:  Positive for congestion, ear pain and sinus pressure. Negative for sneezing and sore throat.   Respiratory:  Positive for cough. Negative for shortness of breath.   Neurological:  Positive for headaches.  All other systems reviewed and are negative.      Objective:   Physical Exam Vitals reviewed.  Constitutional:      General: She is not in acute distress.    Appearance: She is  well-developed.  HENT:     Head: Normocephalic and atraumatic.     Right Ear: Tympanic membrane normal.     Left Ear: Tympanic membrane normal.     Nose:     Left Sinus: Maxillary sinus tenderness present.  Eyes:     Pupils: Pupils are equal, round, and reactive to light.  Neck:     Thyroid: No thyromegaly.  Cardiovascular:     Rate and Rhythm: Normal rate and regular rhythm.     Heart sounds: Normal heart sounds. No murmur heard. Pulmonary:     Effort: Pulmonary effort is normal. No respiratory distress.     Breath sounds: Normal breath sounds. No wheezing.  Abdominal:     General: Bowel sounds are normal. There is no distension.     Palpations: Abdomen is soft.     Tenderness: There is no abdominal tenderness.  Musculoskeletal:        General: No tenderness. Normal range of motion.     Cervical back: Normal range of motion and neck supple.  Skin:    General: Skin is warm and dry.     Findings: Rash present. Rash is urticarial.       Neurological:     Mental Status: She is alert and oriented to person, place, and time.     Cranial Nerves: No cranial nerve deficit.     Deep Tendon Reflexes: Reflexes are normal and symmetric.  Psychiatric:        Behavior: Behavior normal.  Thought Content: Thought content normal.        Judgment: Judgment normal.       BP 119/81   Pulse 82   Temp (!) 96.9 F (36.1 C) (Temporal)   Ht 5\' 4"  (1.626 m)   Wt 195 lb (88.5 kg)   SpO2 97%   BMI 33.47 kg/m      Assessment & Plan:  Tricia Castaneda comes in today with chief complaint of Cough (Since last Sunday ears stopped up ) and Urticaria (Started yesterday )   Diagnosis and orders addressed:  1. Hives Steroid given today Encouraged to write down all meds and foods she has had the last 24 hours - methylPREDNISolone acetate (DEPO-MEDROL) injection 80 mg - hydrOXYzine (VISTARIL) 25 MG capsule; Take 1 capsule (25 mg total) by mouth every 8 (eight) hours as needed.  Dispense: 30  capsule; Refill: 0  2. Acute non-recurrent maxillary sinusitis - Take meds as prescribed - Use a cool mist humidifier  -Use saline nose sprays frequently -Force fluids -For any cough or congestion  Use plain Mucinex- regular strength or max strength is fine -For fever or aces or pains- take tylenol or ibuprofen. -Throat lozenges if help -Follow up if symptoms worsen or do not improve  - doxycycline (VIBRA-TABS) 100 MG tablet; Take 1 tablet (100 mg total) by mouth 2 (two) times daily.  Dispense: 20 tablet; Refill: 0    [name], FNP

## 2023-07-15 ENCOUNTER — Other Ambulatory Visit: Payer: Self-pay | Admitting: Family

## 2023-07-15 DIAGNOSIS — E669 Obesity, unspecified: Secondary | ICD-10-CM

## 2023-07-15 DIAGNOSIS — I10 Essential (primary) hypertension: Secondary | ICD-10-CM

## 2023-07-15 DIAGNOSIS — Z713 Dietary counseling and surveillance: Secondary | ICD-10-CM

## 2023-07-19 ENCOUNTER — Ambulatory Visit: Payer: Managed Care, Other (non HMO) | Admitting: Family

## 2023-07-19 ENCOUNTER — Encounter: Payer: Self-pay | Admitting: Family

## 2023-07-19 VITALS — BP 117/74 | HR 72 | Temp 97.3°F | Ht 64.0 in | Wt 199.6 lb

## 2023-07-19 DIAGNOSIS — I10 Essential (primary) hypertension: Secondary | ICD-10-CM | POA: Diagnosis not present

## 2023-07-19 DIAGNOSIS — E669 Obesity, unspecified: Secondary | ICD-10-CM | POA: Diagnosis not present

## 2023-07-19 DIAGNOSIS — L918 Other hypertrophic disorders of the skin: Secondary | ICD-10-CM | POA: Diagnosis not present

## 2023-07-19 DIAGNOSIS — J301 Allergic rhinitis due to pollen: Secondary | ICD-10-CM

## 2023-07-19 DIAGNOSIS — Z713 Dietary counseling and surveillance: Secondary | ICD-10-CM | POA: Diagnosis not present

## 2023-07-19 DIAGNOSIS — Z6834 Body mass index (BMI) 34.0-34.9, adult: Secondary | ICD-10-CM

## 2023-07-19 MED ORDER — CETIRIZINE HCL 10 MG PO TABS
10.0000 mg | ORAL_TABLET | Freq: Every day | ORAL | 1 refills | Status: DC
Start: 2023-07-19 — End: 2024-03-08

## 2023-07-19 MED ORDER — PHENTERMINE HCL 37.5 MG PO TABS
37.5000 mg | ORAL_TABLET | Freq: Every day | ORAL | 2 refills | Status: DC
Start: 2023-07-19 — End: 2024-03-08

## 2023-07-19 NOTE — Patient Instructions (Signed)

## 2023-07-19 NOTE — Progress Notes (Signed)
 Subjective:    Patient ID: Tricia Castaneda, female    DOB: 04-02-1981, 43 y.o.   MRN: [id number]  Chief Complaint  Patient presents with   Medical Management of Chronic Issues    Skin tag. Wants to go back on phentermine    Pt presents to the office today to restart phentermine. She has taken this in the past and worked well. She has not taken it in a few months.      07/19/2023    9:37 AM 04/05/2023    9:41 AM 12/18/2022   10:17 AM  Last 3 Weights  Weight (lbs) 199 lb 9.6 oz 195 lb 196 lb 14.4 oz  Weight (kg) 90.538 kg 88.451 kg 89.313 kg    She is also complaining of a skin tag in right axilla that has been there for years. However, it has recently become irritated with her bra and clothes.  Hypertension This is a chronic problem. The current episode started more than 1 year ago. The problem has been resolved since onset. The problem is controlled. Pertinent negatives include no malaise/fatigue, peripheral edema or shortness of breath. The current treatment provides moderate improvement.      Review of Systems  Constitutional:  Negative for malaise/fatigue.  Respiratory:  Negative for shortness of breath.   All other systems reviewed and are negative.   Social History   Socioeconomic History   Marital status: Married    Spouse name: Not on file   Number of children: Not on file   Years of education: Not on file   Highest education level: Some college, no degree  Occupational History   Not on file  Tobacco Use   Smoking status: Former    Current packs/day: 0.00    Types: Cigarettes    Quit date: 01/18/2016    Years since quitting: 7.5   Smokeless tobacco: Never  Vaping Use   Vaping status: Never Used  Substance and Sexual Activity   Alcohol use: Yes    Comment: social   Drug use: No   Sexual activity: Yes    Birth control/protection: Pill  Other Topics Concern   Not on file  Social History Narrative   Not on file   Social Drivers of Health   Financial  Resource Strain: Medium Risk (07/17/2023)   Overall Financial Resource Strain (CARDIA)    Difficulty of Paying Living Expenses: Somewhat hard  Food Insecurity: No Food Insecurity (07/17/2023)   Hunger Vital Sign    Worried About Running Out of Food in the Last Year: Never true    Ran Out of Food in the Last Year: Never true  Transportation Needs: No Transportation Needs (07/17/2023)   PRAPARE - Administrator, Civil Service (Medical): No    Lack of Transportation (Non-Medical): No  Physical Activity: Unknown (07/17/2023)   Exercise Vital Sign    Days of Exercise per Week: 0 days    Minutes of Exercise per Session: Not on file  Stress: Stress Concern Present (07/17/2023)   [employer] of Occupational Health - Occupational Stress Questionnaire    Feeling of Stress : Very much  Social Connections: Moderately Integrated (07/17/2023)   Social Connection and Isolation Panel [NHANES]    Frequency of Communication with Friends and Family: More than three times a week    Frequency of Social Gatherings with Friends and Family: Three times a week    Attends Religious Services: 1 to 4 times per year    Active Member of  Clubs or Organizations: No    Attends Banker Meetings: Not on file    Marital Status: Married   History reviewed. No pertinent family history.      Objective:   Physical Exam Vitals reviewed.  Constitutional:      General: She is not in acute distress.    Appearance: She is well-developed.  HENT:     Head: Normocephalic and atraumatic.     Right Ear: Tympanic membrane normal.     Left Ear: Tympanic membrane normal.  Eyes:     Pupils: Pupils are equal, round, and reactive to light.  Neck:     Thyroid: No thyromegaly.  Cardiovascular:     Rate and Rhythm: Normal rate and regular rhythm.     Heart sounds: Normal heart sounds. No murmur heard. Pulmonary:     Effort: Pulmonary effort is normal. No respiratory distress.     Breath sounds: Normal  breath sounds. No wheezing.  Abdominal:     General: Bowel sounds are normal. There is no distension.     Palpations: Abdomen is soft.     Tenderness: There is no abdominal tenderness.  Musculoskeletal:        General: No tenderness. Normal range of motion.     Cervical back: Normal range of motion and neck supple.  Skin:    General: Skin is warm and dry.       Neurological:     Mental Status: She is alert and oriented to person, place, and time.     Cranial Nerves: No cranial nerve deficit.     Deep Tendon Reflexes: Reflexes are normal and symmetric.  Psychiatric:        Behavior: Behavior normal.        Thought Content: Thought content normal.        Judgment: Judgment normal.     Skin tag in right axilla, shaved. Pt tolerated well. Band-aid applied.   BP 117/74   Pulse 72   Temp (!) 97.3 F (36.3 C) (Temporal)   Ht 5\' 4"  (1.626 m)   Wt 199 lb 9.6 oz (90.5 kg)   SpO2 100%   BMI 34.26 kg/m      Assessment & Plan:  Tricia Castaneda comes in today with chief complaint of Medical Management of Chronic Issues (Skin tag. Wants to go back on phentermine )   Diagnosis and orders addressed:  1. Primary hypertension (Primary) At goal   2. Skin tag Band aid applied   3. Obesity (BMI 30-39.9) - phentermine (ADIPEX-P) 37.5 MG tablet; Take 1 tablet (37.5 mg total) by mouth daily before breakfast.  Dispense: 30 tablet; Refill: 2  4. Weight loss counseling, encounter for Will start phentermine  Encourage healthy diet and exercise  - phentermine (ADIPEX-P) 37.5 MG tablet; Take 1 tablet (37.5 mg total) by mouth daily before breakfast.  Dispense: 30 tablet; Refill: 2  5. Allergic rhinitis due to pollen, unspecified seasonality - cetirizine (ZYRTEC ALLERGY) 10 MG tablet; Take 1 tablet (10 mg total) by mouth daily.  Dispense: 90 tablet; Refill: 1     [name], FNP

## 2024-03-05 ENCOUNTER — Other Ambulatory Visit: Payer: Self-pay | Admitting: Family

## 2024-03-05 DIAGNOSIS — I10 Essential (primary) hypertension: Secondary | ICD-10-CM

## 2024-03-09 ENCOUNTER — Ambulatory Visit: Admitting: Family

## 2024-03-09 ENCOUNTER — Encounter: Payer: Self-pay | Admitting: Family

## 2024-03-09 VITALS — BP 113/70 | HR 80 | Temp 96.9°F | Ht 64.0 in | Wt 187.0 lb

## 2024-03-09 DIAGNOSIS — I10 Essential (primary) hypertension: Secondary | ICD-10-CM | POA: Diagnosis not present

## 2024-03-09 DIAGNOSIS — Z0001 Encounter for general adult medical examination with abnormal findings: Secondary | ICD-10-CM | POA: Diagnosis not present

## 2024-03-09 DIAGNOSIS — E669 Obesity, unspecified: Secondary | ICD-10-CM

## 2024-03-09 DIAGNOSIS — J301 Allergic rhinitis due to pollen: Secondary | ICD-10-CM

## 2024-03-09 DIAGNOSIS — Z0000 Encounter for general adult medical examination without abnormal findings: Secondary | ICD-10-CM

## 2024-03-09 DIAGNOSIS — Z713 Dietary counseling and surveillance: Secondary | ICD-10-CM

## 2024-03-09 DIAGNOSIS — F411 Generalized anxiety disorder: Secondary | ICD-10-CM

## 2024-03-09 MED ORDER — HYDROCHLOROTHIAZIDE 25 MG PO TABS
25.0000 mg | ORAL_TABLET | Freq: Every day | ORAL | 4 refills | Status: AC
Start: 2024-03-09 — End: ?

## 2024-03-09 MED ORDER — CETIRIZINE HCL 10 MG PO TABS
10.0000 mg | ORAL_TABLET | Freq: Every day | ORAL | 1 refills | Status: AC
Start: 2024-03-09 — End: ?

## 2024-03-09 MED ORDER — PHENTERMINE HCL 37.5 MG PO TABS: 37.5000 mg | ORAL_TABLET | Freq: Every day | ORAL | 2 refills | Status: AC

## 2024-03-09 NOTE — Progress Notes (Signed)
 Subjective:    Patient ID: Tricia Castaneda, female    DOB: [birth year], 43 y.o.   MRN: [id number]  Chief Complaint  Patient presents with   Annual Exam   Pt presents to the office today for CPE without pap.   She would like to restart phentermine . She has taken this in the past and worked well. She has not taken it in a few months.      03/09/2024   10:35 AM 07/19/2023    9:37 AM 04/05/2023    9:41 AM  Last 3 Weights  Weight (lbs) 187 lb 199 lb 9.6 oz 195 lb  Weight (kg) 84.823 kg 90.538 kg 88.451 kg    Hypertension This is a chronic problem. The current episode started more than 1 year ago. The problem has been resolved since onset. The problem is controlled. Associated symptoms include anxiety and malaise/fatigue. Pertinent negatives include no peripheral edema or shortness of breath. Risk factors for coronary artery disease include obesity. The current treatment provides moderate improvement.  Anxiety Presents for follow-up visit. Symptoms include excessive worry, nervous/anxious behavior and restlessness. Patient reports no shortness of breath. Symptoms occur occasionally. The severity of symptoms is mild.        Review of Systems  Constitutional:  Positive for malaise/fatigue.  Respiratory:  Negative for shortness of breath.   Psychiatric/Behavioral:  The patient is nervous/anxious.   All other systems reviewed and are negative.   Social History   Socioeconomic History   Marital status: Married    Spouse name: Not on file   Number of children: Not on file   Years of education: Not on file   Highest education level: Associate degree: occupational, Scientist, product/process development, or vocational program  Occupational History   Not on file  Tobacco Use   Smoking status: Former    Current packs/day: 0.00    Types: Cigarettes    Quit date: 01/18/2016    Years since quitting: 8.1   Smokeless tobacco: Never  Vaping Use   Vaping status: Never Used  Substance and Sexual Activity   Alcohol  use: Yes    Comment: social   Drug use: No   Sexual activity: Yes    Birth control/protection: Pill  Other Topics Concern   Not on file  Social History Narrative   Not on file   Social Drivers of Health   Financial Resource Strain: Medium Risk (03/08/2024)   Overall Financial Resource Strain (CARDIA)    Difficulty of Paying Living Expenses: Somewhat hard  Food Insecurity: No Food Insecurity (03/08/2024)   Hunger Vital Sign    Worried About Running Out of Food in the Last Year: Never true    Ran Out of Food in the Last Year: Never true  Transportation Needs: No Transportation Needs (03/08/2024)   PRAPARE - Administrator, Civil Service (Medical): No    Lack of Transportation (Non-Medical): No  Physical Activity: Insufficiently Active (03/08/2024)   Exercise Vital Sign    Days of Exercise per Week: 2 days    Minutes of Exercise per Session: 20 min  Stress: Stress Concern Present (03/08/2024)   [employer] of Occupational Health - Occupational Stress Questionnaire    Feeling of Stress: Rather much  Social Connections: Moderately Integrated (03/08/2024)   Social Connection and Isolation Panel    Frequency of Communication with Friends and Family: More than three times a week    Frequency of Social Gatherings with Friends and Family: More than three times a week  Attends Religious Services: More than 4 times per year    Active Member of Clubs or Organizations: No    Attends Banker Meetings: Not on file    Marital Status: Married   History reviewed. No pertinent family history.      Objective:   Physical Exam Vitals reviewed.  Constitutional:      General: She is not in acute distress.    Appearance: She is well-developed.  HENT:     Head: Normocephalic and atraumatic.     Right Ear: Tympanic membrane normal.     Left Ear: Tympanic membrane normal.  Eyes:     Pupils: Pupils are equal, round, and reactive to light.  Neck:     Thyroid :  No thyromegaly.  Cardiovascular:     Rate and Rhythm: Normal rate and regular rhythm.     Heart sounds: Normal heart sounds. No murmur heard. Pulmonary:     Effort: Pulmonary effort is normal. No respiratory distress.     Breath sounds: Normal breath sounds. No wheezing.  Abdominal:     General: Bowel sounds are normal. There is no distension.     Palpations: Abdomen is soft.     Tenderness: There is no abdominal tenderness.  Musculoskeletal:        General: No tenderness. Normal range of motion.     Cervical back: Normal range of motion and neck supple.  Skin:    General: Skin is warm and dry.  Neurological:     Mental Status: She is alert and oriented to person, place, and time.     Cranial Nerves: No cranial nerve deficit.     Deep Tendon Reflexes: Reflexes are normal and symmetric.  Psychiatric:        Behavior: Behavior normal.        Thought Content: Thought content normal.        Judgment: Judgment normal.       BP 113/70   Pulse 80   Temp (!) 96.9 F (36.1 C) (Temporal)   Ht 5' 4 (1.626 m)   Wt 187 lb (84.8 kg)   SpO2 97%   BMI 32.10 kg/m      Assessment & Plan:  Tricia Castaneda comes in today with chief complaint of Annual Exam   Diagnosis and orders addressed:  Tricia Castaneda comes in today with chief complaint of Annual Exam   Diagnosis and orders addressed:  1. Allergic rhinitis due to pollen, unspecified seasonality - cetirizine  (ZYRTEC  ALLERGY) 10 MG tablet; Take 1 tablet (10 mg total) by mouth daily.  Dispense: 90 tablet; Refill: 1 - CBC with Differential/Platelet - CMP14+EGFR  2. Primary hypertension - hydrochlorothiazide  (HYDRODIURIL ) 25 MG tablet; Take 1 tablet (25 mg total) by mouth daily.  Dispense: 90 tablet; Refill: 4 - CBC with Differential/Platelet - CMP14+EGFR  3. Obesity (BMI 30-39.9) - phentermine  (ADIPEX-P ) 37.5 MG tablet; Take 1 tablet (37.5 mg total) by mouth daily before breakfast.  Dispense: 30 tablet; Refill: 2 - CBC with  Differential/Platelet - CMP14+EGFR  4. Weight loss counseling, encounter for - phentermine  (ADIPEX-P ) 37.5 MG tablet; Take 1 tablet (37.5 mg total) by mouth daily before breakfast.  Dispense: 30 tablet; Refill: 2 - CBC with Differential/Platelet - CMP14+EGFR  5. Annual physical exam (Primary) - CBC with Differential/Platelet - CMP14+EGFR - Lipid panel - TSH  6. GAD (generalized anxiety disorder) - CBC with Differential/Platelet - CMP14+EGFR   Labs pending Continue healthy diet and exercise!  Continue current medications  Health Maintenance reviewed  Follow up plan: 3 months    Bari Learn, FNP

## 2024-03-09 NOTE — Patient Instructions (Signed)

## 2024-03-10 ENCOUNTER — Ambulatory Visit: Payer: Self-pay | Admitting: Family

## 2024-03-10 LAB — CBC WITH DIFFERENTIAL/PLATELET
Basophils Absolute: 0 x10E3/uL (ref 0.0–0.2)
Basos: 0 %
EOS (ABSOLUTE): 0.1 x10E3/uL (ref 0.0–0.4)
Eos: 1 %
Hematocrit: 43.3 % (ref 34.0–46.6)
Hemoglobin: 14.4 g/dL (ref 11.1–15.9)
Immature Grans (Abs): 0 x10E3/uL (ref 0.0–0.1)
Immature Granulocytes: 0 %
Lymphocytes Absolute: 3.1 x10E3/uL (ref 0.7–3.1)
Lymphs: 36 %
MCH: 31.5 pg (ref 26.6–33.0)
MCHC: 33.3 g/dL (ref 31.5–35.7)
MCV: 95 fL (ref 79–97)
Monocytes Absolute: 0.6 x10E3/uL (ref 0.1–0.9)
Monocytes: 6 %
Neutrophils Absolute: 4.9 x10E3/uL (ref 1.4–7.0)
Neutrophils: 57 %
Platelets: 313 x10E3/uL (ref 150–450)
RBC: 4.57 x10E6/uL (ref 3.77–5.28)
RDW: 12.7 % (ref 11.7–15.4)
WBC: 8.7 x10E3/uL (ref 3.4–10.8)

## 2024-03-10 LAB — CMP14+EGFR
ALT: 15 IU/L (ref 0–32)
AST: 19 IU/L (ref 0–40)
Albumin: 4.6 g/dL (ref 3.9–4.9)
Alkaline Phosphatase: 76 IU/L (ref 41–116)
BUN/Creatinine Ratio: 13 (ref 9–23)
BUN: 9 mg/dL (ref 6–24)
Bilirubin Total: 0.6 mg/dL (ref 0.0–1.2)
CO2: 21 mmol/L (ref 20–29)
Calcium: 9.2 mg/dL (ref 8.7–10.2)
Chloride: 99 mmol/L (ref 96–106)
Creatinine, Ser: 0.69 mg/dL (ref 0.57–1.00)
Globulin, Total: 2.4 g/dL (ref 1.5–4.5)
Glucose: 94 mg/dL (ref 70–99)
Potassium: 3.7 mmol/L (ref 3.5–5.2)
Sodium: 137 mmol/L (ref 134–144)
Total Protein: 7 g/dL (ref 6.0–8.5)
eGFR: 109 mL/min/1.73 (ref >59)

## 2024-03-10 LAB — LIPID PANEL
Chol/HDL Ratio: 2.6 ratio (ref 0.0–4.4)
Cholesterol, Total: 220 mg/dL — ABNORMAL HIGH (ref 100–199)
HDL: 84 mg/dL (ref >39)
LDL Chol Calc (NIH): 123 mg/dL — ABNORMAL HIGH (ref 0–99)
Triglycerides: 72 mg/dL (ref 0–149)
VLDL Cholesterol Cal: 13 mg/dL (ref 5–40)

## 2024-03-10 LAB — TSH: TSH: 0.721 u[IU]/mL (ref 0.450–4.500)

## 2024-04-24 DIAGNOSIS — J019 Acute sinusitis, unspecified: Secondary | ICD-10-CM

## 2024-04-24 MED ORDER — FLUTICASONE PROPIONATE 50 MCG/ACT NA SUSP: 2.0000 | Freq: Every day | NASAL | 6 refills | Status: DC

## 2024-05-04 MED ORDER — FLUTICASONE PROPIONATE 50 MCG/ACT NA SUSP: 2.0000 | Freq: Every day | NASAL | 6 refills | Status: AC

## 2024-05-04 NOTE — Addendum Note (Signed)
 Addended by: MICHELINE ROSINA FALCON on: 05/04/2024 04:13 PM   Modules accepted: Orders
